# Patient Record
Sex: Female | Born: 1955 | Race: White | Hispanic: No | Marital: Married | Smoking: Current some day smoker
Health system: Southern US, Community
[De-identification: ages and names within clinical notes are randomized; demographics above are authoritative.]

## PROBLEM LIST (undated history)

## (undated) DIAGNOSIS — C569 Malignant neoplasm of unspecified ovary: Secondary | ICD-10-CM

## (undated) DIAGNOSIS — T7840XA Allergy, unspecified, initial encounter: Secondary | ICD-10-CM

## (undated) DIAGNOSIS — K635 Polyp of colon: Secondary | ICD-10-CM

## (undated) DIAGNOSIS — Z8619 Personal history of other infectious and parasitic diseases: Secondary | ICD-10-CM

## (undated) HISTORY — DX: Malignant neoplasm of unspecified ovary: C56.9

## (undated) HISTORY — DX: Allergy, unspecified, initial encounter: T78.40XA

## (undated) HISTORY — DX: Personal history of other infectious and parasitic diseases: Z86.19

## (undated) HISTORY — DX: Polyp of colon: K63.5

---
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
## 2017-09-08 HISTORY — PX: BREAST BIOPSY: SHX20

## 2017-09-08 HISTORY — PX: ABDOMINAL HYSTERECTOMY: SHX81

## 2017-09-08 HISTORY — PX: APPENDECTOMY: SHX54

## 2018-06-30 LAB — PROTIME-INR: INR: 1 (ref 0.9–1.1)

## 2020-06-14 ENCOUNTER — Encounter: Payer: Self-pay | Admitting: Adult Health

## 2020-06-14 ENCOUNTER — Other Ambulatory Visit: Payer: Self-pay

## 2020-06-14 ENCOUNTER — Ambulatory Visit (INDEPENDENT_AMBULATORY_CARE_PROVIDER_SITE_OTHER): Payer: Medicare Other | Admitting: Adult Health

## 2020-06-14 VITALS — BP 92/60 | HR 62 | Temp 98.0°F | Ht 64.0 in | Wt 114.2 lb

## 2020-06-14 DIAGNOSIS — Z23 Encounter for immunization: Secondary | ICD-10-CM

## 2020-06-14 DIAGNOSIS — Z7689 Persons encountering health services in other specified circumstances: Secondary | ICD-10-CM

## 2020-06-14 DIAGNOSIS — Z8543 Personal history of malignant neoplasm of ovary: Secondary | ICD-10-CM

## 2020-06-14 DIAGNOSIS — K635 Polyp of colon: Secondary | ICD-10-CM | POA: Diagnosis not present

## 2020-06-14 DIAGNOSIS — C569 Malignant neoplasm of unspecified ovary: Secondary | ICD-10-CM | POA: Insufficient documentation

## 2020-06-14 NOTE — Patient Instructions (Addendum)
It was great meeting you today   We will follow up with you regarding your blood work   Please schedule a physical exam   Please let me know if you need anything in the meantime

## 2020-06-14 NOTE — Progress Notes (Signed)
  Patient presents to clinic today to establish care. She is a pleasant 64 year old female who  has a past medical history of Allergy, Colon polyps, History of chicken pox, and Ovarian cancer (HCC).  Recently moved from [place] in June 2021.   Acute Concerns: Establish Care   Chronic Issues: Ovarian Cancer - Was diagnosed in 2019. Had a hysterectomy in 2019.  Was seen oncology 4 times a year for routine blood work.  She would like to have blood work done today  Seasonal Allergies - uses OTC allergy medications as needed.   Colon Polyps - last colonoscopy in 2019.  Had a few polyps.  She will request her information from her old gastroenterologist in [place]  Tobacco Use - smokes when stressed  Anxiety/Stress - takes Xanax 1 mg QHS   Health Maintenance: Dental -- Routine Care  Vision - Routine Care  Immunizations -- UTD  Colonoscopy -- 2019 - history of colon polyps  Mammogram -- 2019   PAP -- No longer needs    Past Medical History:  Diagnosis Date  . Allergy   . Colon polyps    history of precancerous  . History of chicken pox   . Ovarian cancer (HCC)    diagnosed 2 years ago-treated by a physician in [place]    Past Surgical History:  Procedure Laterality Date  . ABDOMINAL HYSTERECTOMY  2019  . APPENDECTOMY  2019  . BREAST BIOPSY Right 2019   benign per patient  . TONSILLECTOMY  1965    No current outpatient medications on file prior to visit.   No current facility-administered medications on file prior to visit.    Not on File  Family History  Problem Relation Age of Onset  . Arthritis Mother   . Diabetes Mother   . Hearing loss Mother   . Hypertension Mother   . Hyperlipidemia Mother   . Depression Father   . Heart disease Father   . Heart attack Father   . Multiple myeloma Brother   . Early death Brother   . Hearing loss Maternal Grandmother   . Alcohol abuse Maternal Grandfather   . Cancer Maternal Grandfather   . Cancer Paternal  Grandmother   . Early death Paternal Grandmother   . Miscarriages / Stillbirths Paternal Grandmother   . Diabetes Paternal Grandfather     Social History   Socioeconomic History  . Marital status: Married    Spouse name: Not on file  . Number of children: Not on file  . Years of education: Not on file  . Highest education level: Not on file  Occupational History  . Not on file  Tobacco Use  . Smoking status: Current Some Day Smoker  . Smokeless tobacco: Never Used  Vaping Use  . Vaping Use: Never used  Substance and Sexual Activity  . Alcohol use: Yes  . Drug use: Not Currently  . Sexual activity: Not Currently  Other Topics Concern  . Not on file  Social History Narrative  . Not on file   Social Determinants of Health   Financial Resource Strain:   . Difficulty of Paying Living Expenses: Not on file  Food Insecurity:   . Worried About Running Out of Food in the Last Year: Not on file  . Ran Out of Food in the Last Year: Not on file  Transportation Needs:   . Lack of Transportation (Medical): Not on file  . Lack of Transportation (Non-Medical): Not on file  Physical   Activity:   . Days of Exercise per Week: Not on file  . Minutes of Exercise per Session: Not on file  Stress:   . Feeling of Stress : Not on file  Social Connections:   . Frequency of Communication with Friends and Family: Not on file  . Frequency of Social Gatherings with Friends and Family: Not on file  . Attends Religious Services: Not on file  . Active Member of Clubs or Organizations: Not on file  . Attends Archivist Meetings: Not on file  . Marital Status: Not on file  Intimate Partner Violence:   . Fear of Current or Ex-Partner: Not on file  . Emotionally Abused: Not on file  . Physically Abused: Not on file  . Sexually Abused: Not on file    Review of Systems  Constitutional: Negative.   HENT: Negative.   Eyes: Negative.   Respiratory: Negative.   Cardiovascular: Negative.    Gastrointestinal: Negative.   Genitourinary: Negative.   Musculoskeletal: Negative.   Skin: Negative.   Endo/Heme/Allergies: Negative.   Psychiatric/Behavioral: Negative.   All other systems reviewed and are negative.   BP 92/60 (BP Location: Left Arm, Patient Position: Sitting, Cuff Size: Normal)   Pulse 62   Temp 98 F (36.7 C) (Oral)   Ht 5' 4" (1.626 m)   Wt 114 lb 3.2 oz (51.8 kg)   BMI 19.60 kg/m   Physical Exam Vitals and nursing note reviewed.  Constitutional:      Appearance: Normal appearance.  Cardiovascular:     Rate and Rhythm: Normal rate and regular rhythm.     Pulses: Normal pulses.     Heart sounds: Normal heart sounds.  Pulmonary:     Breath sounds: Normal breath sounds.  Musculoskeletal:        General: Normal range of motion.  Skin:    General: Skin is warm and dry.     Capillary Refill: Capillary refill takes less than 2 seconds.  Neurological:     General: No focal deficit present.     Mental Status: She is alert and oriented to person, place, and time.  Psychiatric:        Mood and Affect: Mood normal.        Behavior: Behavior normal.        Thought Content: Thought content normal.        Judgment: Judgment normal.    Assessment/Plan: 1. Encounter to establish care - Follow up for CPE  - Will review her previous notes once they are faxed over - Stop smoking   2. History of ovarian cancer  - Ambulatory referral to Oncology - CA 125; Future - CEA; Future - CEA - CA 125  3. Need for immunization against influenza  - Flu Vaccine QUAD High Dose(Fluad)   Dorothyann Peng, NP

## 2020-06-15 ENCOUNTER — Telehealth: Payer: Self-pay | Admitting: *Deleted

## 2020-06-15 LAB — CEA: CEA: 1.3 ng/mL

## 2020-06-15 LAB — CA 125: CA 125: 9 U/mL (ref <35)

## 2020-06-15 NOTE — Telephone Encounter (Signed)
Called the patient and scheduled a new patient with Dr Denman George on 10/25 at 11:15 am. Gave the address and phone number for the clinic

## 2020-06-18 ENCOUNTER — Telehealth: Payer: Self-pay | Admitting: *Deleted

## 2020-06-18 ENCOUNTER — Telehealth: Payer: Self-pay | Admitting: Adult Health

## 2020-06-18 NOTE — Telephone Encounter (Signed)
Melissa Holmes from [facility] is calling needing records from Melissa Holmes  that we obtained from another facility for them to be able to keep her scheduled appointment. Please advise Melissa Holmes at [phone].

## 2020-06-18 NOTE — Telephone Encounter (Signed)
Called Edson Snowball, NP and requested records

## 2020-06-19 NOTE — Telephone Encounter (Signed)
After reviewing the chart, I noticed a referral was placed on 10/7.  Spoke with Barbaraann Share at the number below and she stated the pt has an appt on 10/25 and they will not be able to see her without records.  She asked these be faxed to [phone] and message was forwarded to the referral coordinator.

## 2020-06-21 ENCOUNTER — Encounter: Payer: Self-pay | Admitting: Adult Health

## 2020-06-21 ENCOUNTER — Other Ambulatory Visit: Payer: Self-pay

## 2020-06-21 ENCOUNTER — Ambulatory Visit (INDEPENDENT_AMBULATORY_CARE_PROVIDER_SITE_OTHER): Payer: Medicare Other | Admitting: Adult Health

## 2020-06-21 VITALS — BP 112/60 | HR 68 | Temp 98.5°F | Ht 64.0 in | Wt 111.2 lb

## 2020-06-21 DIAGNOSIS — E538 Deficiency of other specified B group vitamins: Secondary | ICD-10-CM

## 2020-06-21 DIAGNOSIS — Z0000 Encounter for general adult medical examination without abnormal findings: Secondary | ICD-10-CM | POA: Diagnosis not present

## 2020-06-21 DIAGNOSIS — F419 Anxiety disorder, unspecified: Secondary | ICD-10-CM

## 2020-06-21 DIAGNOSIS — Z8543 Personal history of malignant neoplasm of ovary: Secondary | ICD-10-CM | POA: Diagnosis not present

## 2020-06-21 NOTE — Patient Instructions (Signed)
It was great seeing you today   We will follow up with you regarding your blood work   I will see you back in one year unless you need anything.

## 2020-06-21 NOTE — Progress Notes (Signed)
Subjective:    Patient ID: Melissa Holmes, female    DOB: 05/26/1956, 64 y.o.   MRN: [id number]  HPI Patient presents for yearly preventative medicine examination. She is a pleasant 64 year old female who  has a past medical history of Allergy, Colon polyps, History of chicken pox, and Ovarian cancer ([place]).  Ovarian Cancer -diagnosed in 2019.  Had a hysterectomy in 2019.  She was recently referred to GYN oncology through the [facility] system since she moved from [place] had and has an upcoming appointment.  We checked her Ca1 25 and CEA labs recently which were normal.  Tobacco Use - smokes when stressed  Anxiety - takes xanax 1 mg PRN   B12 Deficiency -reports that her B12 levels have been low in the past.  All immunizations and health maintenance protocols were reviewed with the patient and needed orders were placed.  Appropriate screening laboratory values were ordered for the patient including screening of hyperlipidemia, renal function and hepatic function.  Medication reconciliation,  past medical history, social history, problem list and allergies were reviewed in detail with the patient  Goals were established with regard to weight loss, exercise, and  diet in compliance with medications.  She does try to eat a heart healthy diet and enjoys doing yoga and being active   Review of Systems  Constitutional: Negative.   HENT: Negative.   Eyes: Negative.   Respiratory: Negative.   Cardiovascular: Negative.   Gastrointestinal: Negative.   Endocrine: Negative.   Genitourinary: Negative.   Musculoskeletal: Negative.   Skin: Negative.   Allergic/Immunologic: Negative.   Neurological: Negative.   Hematological: Negative.   Psychiatric/Behavioral: Negative.    Past Medical History:  Diagnosis Date   Allergy    Colon polyps    history of precancerous   History of chicken pox    Ovarian cancer ([place])    diagnosed 2 years ago-treated by a physician in [place] History   Marital status: Married    Spouse name: Not on file   Number of children: Not on file   Years of education: Not on file   Highest education level: Not on file  Occupational History   Not on file  Tobacco Use   Smoking status: Current Some Day Smoker   Smokeless tobacco: Never Used  Vaping Use   Vaping Use: Never used  Substance and Sexual Activity   Alcohol use: Yes   Drug use: Not Currently   Sexual activity: Not Currently  Other Topics Concern   Not on file  Social History Narrative   Not on file   Social Determinants of Health   Financial Resource Strain:    Difficulty of Paying Living Expenses: Not on file  Food Insecurity:    Worried About [place] in the Last Year: Not on file   Ran Out of Food in the Last Year: Not on file  Transportation Needs:    Lack of Transportation (Medical): Not on file   Lack of Transportation (Non-Medical): Not on file  Physical Activity:    Days of Exercise per Week: Not on file   Minutes of Exercise per Session: Not on file  Stress:    Feeling of Stress : Not on file  Social Connections:    Frequency of Communication with Friends and Family: Not on file   Frequency of Social Gatherings with Friends and Family: Not on file   Attends Religious Services: Not  on file   Active Member of Clubs or Organizations: Not on file   Attends Archivist Meetings: Not on file   Marital Status: Not on file  Intimate Partner Violence:    Fear of Current or Ex-Partner: Not on file   Emotionally Abused: Not on file   Physically Abused: Not on file   Sexually Abused: Not on file    Past Surgical History:  Procedure Laterality Date   ABDOMINAL HYSTERECTOMY  2019   APPENDECTOMY  2019   BREAST BIOPSY Right 2019   benign per patient   TONSILLECTOMY  1965    Family History  Problem Relation Age of Onset   Arthritis Mother    Diabetes Mother     Hearing loss Mother    Hypertension Mother    Hyperlipidemia Mother    Depression Father    Heart disease Father    Heart attack Father    Multiple myeloma Brother    Early death Brother    Hearing loss Maternal Grandmother    Alcohol abuse Maternal Grandfather    Cancer Maternal Grandfather    Cancer Paternal Grandmother    Early death Paternal Grandmother    Miscarriages / Stillbirths Paternal Grandmother    Diabetes Paternal Grandfather     No Known Allergies  No current outpatient medications on file prior to visit.   No current facility-administered medications on file prior to visit.    BP 112/60 (BP Location: Left Arm, Patient Position: Sitting, Cuff Size: Normal)    Pulse 68    Temp 98.5 F (36.9 C) (Oral)    Ht '5\' 4"'  (1.626 m)    Wt 111 lb 3.2 oz (50.4 kg)    BMI 19.09 kg/m       Objective:   Physical Exam Vitals and nursing note reviewed.  Constitutional:      General: She is not in acute distress.    Appearance: Normal appearance. She is well-developed. She is not ill-appearing.  HENT:     Head: Normocephalic and atraumatic.     Right Ear: Tympanic membrane, ear canal and external ear normal. There is no impacted cerumen.     Left Ear: Tympanic membrane, ear canal and external ear normal. There is no impacted cerumen.     Nose: Nose normal. No congestion or rhinorrhea.     Mouth/Throat:     Mouth: Mucous membranes are moist.     Pharynx: Oropharynx is clear. No oropharyngeal exudate or posterior oropharyngeal erythema.  Eyes:     General:        Right eye: No discharge.        Left eye: No discharge.     Extraocular Movements: Extraocular movements intact.     Conjunctiva/sclera: Conjunctivae normal.     Pupils: Pupils are equal, round, and reactive to light.  Neck:     Thyroid: No thyromegaly.     Vascular: No carotid bruit.     Trachea: No tracheal deviation.  Cardiovascular:     Rate and Rhythm: Normal rate and regular rhythm.      Pulses: Normal pulses.     Heart sounds: Normal heart sounds. No murmur heard.  No friction rub. No gallop.   Pulmonary:     Effort: Pulmonary effort is normal. No respiratory distress.     Breath sounds: Normal breath sounds. No stridor. No wheezing, rhonchi or rales.  Chest:     Chest wall: No tenderness.  Abdominal:     General: Abdomen  is flat. Bowel sounds are normal. There is no distension.     Palpations: Abdomen is soft. There is no mass.     Tenderness: There is no abdominal tenderness. There is no right CVA tenderness, left CVA tenderness, guarding or rebound.     Hernia: No hernia is present.  Musculoskeletal:        General: No swelling, tenderness, deformity or signs of injury. Normal range of motion.     Cervical back: Normal range of motion and neck supple.     Right lower leg: No edema.     Left lower leg: No edema.  Lymphadenopathy:     Cervical: No cervical adenopathy.  Skin:    General: Skin is warm and dry.     Coloration: Skin is not jaundiced or pale.     Findings: No bruising, erythema, lesion or rash.  Neurological:     General: No focal deficit present.     Mental Status: She is alert and oriented to person, place, and time.     Cranial Nerves: No cranial nerve deficit.     Sensory: No sensory deficit.     Motor: No weakness.     Coordination: Coordination normal.     Gait: Gait normal.     Deep Tendon Reflexes: Reflexes normal.  Psychiatric:        Mood and Affect: Mood normal.        Behavior: Behavior normal.        Thought Content: Thought content normal.        Judgment: Judgment normal.       Assessment & Plan:  1. Routine general medical examination at a health care facility - Follow up in one year or sooner if needed - Continue heart healthy diet and exercise - CBC with Differential/Platelet; Future - Lipid panel; Future - TSH; Future - CMP with eGFR([facility]); Future - CMP with eGFR([facility]) - TSH - Lipid panel - CBC with  Differential/Platelet  2. History of ovarian cancer - Follow up with oncology as directed  3. B12 deficiency - Consider replacement  - Vitamin B12; Future - Vitamin B12  4. Anxiety - Continue with xanax PRN  Dorothyann Peng, NP

## 2020-06-22 LAB — CBC WITH DIFFERENTIAL/PLATELET
Absolute Monocytes: 377 cells/uL (ref 200–950)
Basophils Absolute: 70 cells/uL (ref 0–200)
Basophils Relative: 1.2 %
Eosinophils Absolute: 58 cells/uL (ref 15–500)
Eosinophils Relative: 1 %
HCT: 36.5 % (ref 35.0–45.0)
Hemoglobin: 12.5 g/dL (ref 11.7–15.5)
Lymphs Abs: 2778 cells/uL (ref 850–3900)
MCH: 33.6 pg — ABNORMAL HIGH (ref 27.0–33.0)
MCHC: 34.2 g/dL (ref 32.0–36.0)
MCV: 98.1 fL (ref 80.0–100.0)
MPV: 10.3 fL (ref 7.5–12.5)
Monocytes Relative: 6.5 %
Neutro Abs: 2517 cells/uL (ref 1500–7800)
Neutrophils Relative %: 43.4 %
Platelets: 388 10*3/uL (ref 140–400)
RBC: 3.72 10*6/uL — ABNORMAL LOW (ref 3.80–5.10)
RDW: 12.9 % (ref 11.0–15.0)
Total Lymphocyte: 47.9 %
WBC: 5.8 10*3/uL (ref 3.8–10.8)

## 2020-06-22 LAB — LIPID PANEL
Cholesterol: 195 mg/dL (ref <200)
HDL: 87 mg/dL (ref >=50)
LDL Cholesterol (Calc): 90 mg/dL
Non-HDL Cholesterol (Calc): 108 mg/dL (ref <130)
Total CHOL/HDL Ratio: 2.2 (calc) (ref <5.0)
Triglycerides: 86 mg/dL (ref <150)

## 2020-06-22 LAB — TSH: TSH: 1.38 m[IU]/L (ref 0.40–4.50)

## 2020-06-22 LAB — COMPLETE METABOLIC PANEL WITH GFR
AG Ratio: 2 (calc) (ref 1.0–2.5)
ALT: 12 U/L (ref 6–29)
AST: 19 U/L (ref 10–35)
Albumin: 4.9 g/dL (ref 3.6–5.1)
Alkaline phosphatase (APISO): 32 U/L — ABNORMAL LOW (ref 37–153)
BUN: 7 mg/dL (ref 7–25)
CO2: 29 mmol/L (ref 20–32)
Calcium: 10.2 mg/dL (ref 8.6–10.4)
Chloride: 102 mmol/L (ref 98–110)
Creat: 0.65 mg/dL (ref 0.50–0.99)
GFR, Est African American: 108 mL/min/{1.73_m2} (ref >=60)
GFR, Est Non African American: 93 mL/min/{1.73_m2} (ref >=60)
Globulin: 2.5 g/dL (calc) (ref 1.9–3.7)
Glucose, Bld: 77 mg/dL (ref 65–99)
Potassium: 4.6 mmol/L (ref 3.5–5.3)
Sodium: 140 mmol/L (ref 135–146)
Total Bilirubin: 0.3 mg/dL (ref 0.2–1.2)
Total Protein: 7.4 g/dL (ref 6.1–8.1)

## 2020-06-22 LAB — VITAMIN B12: Vitamin B-12: 465 pg/mL (ref 200–1100)

## 2020-06-28 ENCOUNTER — Telehealth: Payer: Self-pay | Admitting: *Deleted

## 2020-06-28 NOTE — Telephone Encounter (Signed)
Called the [place]; patient stated that her PCP told her they had just received them. Patient also moved her appt from 10/25 to 12/2. Patient in the progress of moving

## 2020-07-02 ENCOUNTER — Ambulatory Visit: Payer: Medicare Other | Admitting: Gynecologic Oncology

## 2020-07-18 ENCOUNTER — Other Ambulatory Visit: Payer: Self-pay | Admitting: Adult Health

## 2020-07-18 ENCOUNTER — Telehealth: Payer: Self-pay | Admitting: Adult Health

## 2020-07-18 MED ORDER — ALPRAZOLAM 1 MG PO TABS: 1.0000 mg | ORAL_TABLET | Freq: Every day | ORAL | 2 refills | Status: DC | PRN

## 2020-07-18 NOTE — Telephone Encounter (Signed)
Patient stated that she is a new patient here at was prescribed Xanax from her former PCP in [place] and is almost out. Pt wanted to see if she can get a refill sent to [facility] on [address]. Pharamcy # is [phone]. CB is [phone]

## 2020-07-18 NOTE — Telephone Encounter (Signed)
Spoke with patient regarding request for refill on Xanax per patient when she came in for visit she had Rx with her to show that she takes medication. She will be moving this weekend and would like Rx filled if possible. Last OV 06/21/20. Please advise no history or medication Xanax 1mg .

## 2020-08-01 DIAGNOSIS — Z03818 Encounter for observation for suspected exposure to other biological agents ruled out: Secondary | ICD-10-CM | POA: Diagnosis not present

## 2020-08-01 DIAGNOSIS — Z20822 Contact with and (suspected) exposure to covid-19: Secondary | ICD-10-CM | POA: Diagnosis not present

## 2020-08-03 ENCOUNTER — Telehealth: Payer: Self-pay | Admitting: *Deleted

## 2020-08-03 NOTE — Telephone Encounter (Signed)
Called and left the patient a message to call the office back. Need to move the patient from Dr Denman George to Dr Berline Lopes

## 2020-08-06 ENCOUNTER — Telehealth: Payer: Self-pay | Admitting: *Deleted

## 2020-08-06 NOTE — Telephone Encounter (Signed)
Called patient to move appt from Dr Denman George to Dr Berline Lopes. Patient requested to keep Dr Denman George. Explained that I would call her back with a new appt

## 2020-08-09 ENCOUNTER — Ambulatory Visit: Payer: Medicare Other | Admitting: Gynecologic Oncology

## 2020-08-28 ENCOUNTER — Telehealth: Payer: Self-pay | Admitting: Adult Health

## 2020-08-28 NOTE — Telephone Encounter (Signed)
ALPRAZolam (XANAX) 1 MG tablet needs 30 pills. If the dr cant do 30- she will do 20 need by thursday if not called in please call the patient - per patient request   [facility] is the patient new pharmacy, [address], [place],  [address]. please add to the chart new pharmacy

## 2020-08-29 NOTE — Telephone Encounter (Signed)
Spoke with the pt and informed her the Rx was sent by [facility] on 11/10 for #20 with 2 refills.  Patient asked if this could be sent to a different [facility] and I advised her to ask the pharmacy if this can be transferred and she agreed.

## 2020-09-04 ENCOUNTER — Telehealth: Payer: Self-pay

## 2020-09-04 NOTE — Telephone Encounter (Signed)
TC to review meaningful use information, no answer, left message to return call.

## 2020-09-05 ENCOUNTER — Encounter: Payer: Self-pay | Admitting: Gynecologic Oncology

## 2020-09-05 ENCOUNTER — Other Ambulatory Visit: Payer: Self-pay

## 2020-09-05 ENCOUNTER — Inpatient Hospital Stay: Payer: Medicare Other | Attending: Gynecologic Oncology | Admitting: Gynecologic Oncology

## 2020-09-05 VITALS — BP 106/67 | HR 66 | Temp 97.3°F | Resp 18 | Ht 64.0 in | Wt 118.3 lb

## 2020-09-05 DIAGNOSIS — Z08 Encounter for follow-up examination after completed treatment for malignant neoplasm: Secondary | ICD-10-CM | POA: Diagnosis not present

## 2020-09-05 DIAGNOSIS — Z90710 Acquired absence of both cervix and uterus: Secondary | ICD-10-CM | POA: Insufficient documentation

## 2020-09-05 DIAGNOSIS — Z8543 Personal history of malignant neoplasm of ovary: Secondary | ICD-10-CM | POA: Diagnosis present

## 2020-09-05 DIAGNOSIS — C569 Malignant neoplasm of unspecified ovary: Secondary | ICD-10-CM

## 2020-09-05 DIAGNOSIS — Z90722 Acquired absence of ovaries, bilateral: Secondary | ICD-10-CM | POA: Diagnosis not present

## 2020-09-05 NOTE — Progress Notes (Signed)
Consult Note: Gyn-Onc  Consult was requested by Melissa Holmes for the evaluation of Melissa Holmes 64 y.o. female  CC:  Chief Complaint  Patient presents with  . Malignant neoplasm of ovary, unspecified laterality [facility])    Assessment/Plan:  Melissa Holmes  is a 64 y.o.  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  She is NED on today's exam and recent tumor marker assessment.  We reviewed signs and symptoms of recurrence and recommended she contact us for these.   She will return to see me at 6 monthly intervals for surveillance exams and CA 125 assessments until December, 2024.    HPI: Melissa Holmes is a 64 year old P0 who was seen in consultation at the request of Melissa Provencal NP for evaluation of a history of right ovarian cancer diagnosed in 2019.  The patient's tumor history is as follows.  She developed right lower quadrant pain and GI distress in September 2019.  She was living in [place] at that time in [place].  She was seen in emergency department operated on with a laparoscopic appendectomy on Labor Day weekend.  Approximately 1 month following the surgery she developed new right lower quadrant pain and indigestion and was seen again in the emergency department in this time diagnosed with a right ovarian cystic mass that measured approximately 8 cm and was complex cyst, cystic and solid.  She was taken to the operating room for laparoscopic BSO on July 01, 2018.  Pathology from this specimen revealed a FIGO grade 1 endometrioid adenocarcinoma of the right ovary with no surface involvement.  She met with Dr. Kristen Loader at [facility] in [facility] for definitive surgical staging that was performed on 07/21/2018.  This included a robotic hysterectomy, pelvic and para-aortic lymphadenectomy, omentectomy, peritoneal biopsies.  No residual carcinoma or metastatic carcinoma was  identified in any of the specimens.  Complex atypical hyperplasia was noted within the endometrium with no invasion present.  This was felt to be unrelated to her ovarian carcinoma.  She received definitive staging is a stage Ia grade 1 endometrioid adenocarcinoma of the right ovary.  She reported undergoing genetic testing at [facility] in [facility] at the cancer center.  She reported this was negative for BRCA mutations.  No adjuvant therapy was recommended due to her early stage low-grade lesion.  She did not present for typical in person follow-up due to the emergence of the COVID-19 pandemic.  However she did intermittently have virtual visits with a general medical oncologist and had Ca1 25 lab evaluations at 3 monthly intervals.  She relocated from [place] to [place] in the summer 2021 in order to be closer in proximity to her 48 year old mother.  At that time she established care with Melissa Provencal NP.  Ca1 25 was drawn on 08/09/2020 and was normal at 9.  Her medical history is most significant for anxiety.  Her surgical history is most significant for lap appy, lap BSO, robotic hysterectomy and staging including PA and pelvic lymphadenectomy.  Her gynecologic history is remarkable for P0.  Her family cancer history is significant for a brother with a history of multiple myeloma and a grandfather with lung cancer.  She is not currently employed but previously worked as in [employer]. She lives with her husband of 52 years.  She reported no symptoms concerning for recurrence.   Current Meds:  Outpatient Encounter  Medications as of 09/05/2020  Medication Sig  . ALPRAZolam (XANAX) 1 MG tablet Take 1 tablet (1 mg total) by mouth daily as needed for anxiety.  . ASCORBIC ACID PO Take by mouth daily in the afternoon.  . Cholecalciferol (D3 VITAMIN PO) Take by mouth daily in the afternoon.  . Lactobacillus (PROBIOTIC ACIDOPHILUS PO) Take by mouth  daily in the afternoon.   No facility-administered encounter medications on file as of 09/05/2020.    Allergy: No Known Allergies  Social Hx:   Social History   Socioeconomic History  . Marital status: Married    Spouse name: Not on file  . Number of children: Not on file  . Years of education: Not on file  . Highest education level: Not on file  Occupational History  . Not on file  Tobacco Use  . Smoking status: Current Some Day Smoker  . Smokeless tobacco: Never Used  Vaping Use  . Vaping Use: Never used  Substance and Sexual Activity  . Alcohol use: Yes  . Drug use: Not Currently  . Sexual activity: Not Currently  Other Topics Concern  . Not on file  Social History Narrative  . Not on file   Social Determinants of Health   Financial Resource Strain: Not on file  Food Insecurity: Not on file  Transportation Needs: Not on file  Physical Activity: Not on file  Stress: Not on file  Social Connections: Not on file  Intimate Partner Violence: Not on file    Past Surgical Hx:  Past Surgical History:  Procedure Laterality Date  . ABDOMINAL HYSTERECTOMY  2019  . APPENDECTOMY  2019  . BREAST BIOPSY Right 2019   benign per patient  . TONSILLECTOMY  1965    Past Medical Hx:  Past Medical History:  Diagnosis Date  . Allergy   . Colon polyps    history of precancerous  . History of chicken pox   . Ovarian cancer [facility])    diagnosed 2 years ago-treated by a physician in [place]    Past Gynecological History:  See HPI, G0 No LMP recorded. Patient has had a hysterectomy.  Family Hx:  Family History  Problem Relation Age of Onset  . Arthritis Mother   . Diabetes Mother   . Hearing loss Mother   . Hypertension Mother   . Hyperlipidemia Mother   . Depression Father   . Heart disease Father   . Heart attack Father   . Multiple myeloma Brother   . Early death Brother   . Hearing loss Maternal Grandmother   . Alcohol abuse Maternal Grandfather   . Cancer  Maternal Grandfather   . Cancer Paternal Grandmother   . Early death Paternal Grandmother   . Miscarriages / Stillbirths Paternal Grandmother   . Diabetes Paternal Grandfather     Review of Systems:  Constitutional  Feels well,    ENT Normal appearing ears and nares bilaterally Skin/Breast  No rash, sores, jaundice, itching, dryness Cardiovascular  No chest pain, shortness of breath, or edema  Pulmonary  No cough or wheeze.  Gastro Intestinal  No nausea, vomitting, or diarrhoea. No bright red blood per rectum, no abdominal pain, change in bowel movement, or constipation.  Genito Urinary  No frequency, urgency, dysuria, no bleeding Musculo Skeletal  No myalgia, arthralgia, joint swelling or pain  Neurologic  No weakness, numbness, change in gait,  Psychology  No depression, anxiety, insomnia.   Vitals:  Blood pressure 106/67, pulse 66, temperature (!) 97.3 F (  36.3 C), temperature source Tympanic, resp. rate 18, height _0  (1.626 m), weight 118 lb 4.8 oz (53.7 kg), SpO2 100 %.  Physical Exam: WD in NAD Neck  Supple NROM, without any enlargements.  Lymph Node Survey No cervical supraclavicular or inguinal adenopathy Cardiovascular  Pulse normal rate, regularity and rhythm. S1 and S2 normal.  Lungs  Clear to auscultation bilateraly, without wheezes/crackles/rhonchi. Good air movement.  Skin  No rash/lesions/breakdown  Psychiatry  Alert and oriented to person, place, and time  Abdomen  Normoactive bowel sounds, abdomen soft, non-tender and thin without evidence of hernia. Soft laparoscopic incisions. Back No CVA tenderness Genito Urinary  Vulva/vagina: Normal external female genitalia.   No lesions. No discharge or bleeding.  Bladder/urethra:  No lesions or masses, well supported bladder  Vagina: palpably normal  Cervix and uterus surgically absent  Adnexa: no palpable masses. Rectal  deferred Extremities  No bilateral cyanosis, clubbing or edema.  60  minutes of total time was spent for this patient encounter, including preparation, face-to-face counseling with the patient and coordination of care, review of extensive cancer records from outside provders, communication with the referring provider and documentation of the encounter.   Thereasa Solo, MD  09/05/2020, 11:22 AM

## 2020-09-05 NOTE — Patient Instructions (Signed)
Dr Andrey Farmer is recommending follow-up with her every 6 months for the next 3 years. These visits will include an abdominal and pelvic exam and a CA 125 tumor marker lab.  She will see you again in June.  Please notify Dr Andrey Farmer at phone number [phone] if you notice vaginal bleeding, new pelvic or abdominal pains, bloating, feeling full easy, or a change in bladder or bowel function.

## 2020-09-11 ENCOUNTER — Other Ambulatory Visit: Payer: Self-pay | Admitting: Adult Health

## 2020-09-11 DIAGNOSIS — Z1231 Encounter for screening mammogram for malignant neoplasm of breast: Secondary | ICD-10-CM

## 2020-09-19 ENCOUNTER — Other Ambulatory Visit: Payer: Self-pay

## 2020-09-20 ENCOUNTER — Ambulatory Visit (INDEPENDENT_AMBULATORY_CARE_PROVIDER_SITE_OTHER): Payer: Medicare Other | Admitting: Adult Health

## 2020-09-20 ENCOUNTER — Encounter: Payer: Self-pay | Admitting: Adult Health

## 2020-09-20 ENCOUNTER — Ambulatory Visit (INDEPENDENT_AMBULATORY_CARE_PROVIDER_SITE_OTHER): Payer: Medicare Other

## 2020-09-20 VITALS — BP 120/72 | Temp 98.3°F | Wt 115.0 lb

## 2020-09-20 DIAGNOSIS — M25562 Pain in left knee: Secondary | ICD-10-CM | POA: Diagnosis not present

## 2020-09-20 DIAGNOSIS — G8929 Other chronic pain: Secondary | ICD-10-CM

## 2020-09-20 DIAGNOSIS — M1712 Unilateral primary osteoarthritis, left knee: Secondary | ICD-10-CM | POA: Diagnosis not present

## 2020-09-20 NOTE — Progress Notes (Signed)
Subjective:    Patient ID: Melissa Holmes, female    DOB: 1956-06-20, 65 y.o.   MRN: [id number]  HPI 65 year old female who  has a past medical history of Allergy, Colon polyps, History of chicken pox, and Ovarian cancer ([facility]).  She presents to the office today for the complaint of left knee pain. She reports that the pain has been present for multiple years. Pain is more noticeable when she does certain movements such as squats and is more severe after working out at [employer].  She does report that the pain improves after taking a break from working out.  Is able to ambulate without difficulty.  Pain is located right below the kneecap.   Review of Systems See HPI   Past Medical History:  Diagnosis Date  . Allergy   . Colon polyps    history of precancerous  . History of chicken pox   . Ovarian cancer [facility])    diagnosed 2 years ago-treated by a physician in [place] History  . Marital status: Married    Spouse name: Not on file  . Number of children: Not on file  . Years of education: Not on file  . Highest education level: Not on file  Occupational History  . Not on file  Tobacco Use  . Smoking status: Current Some Day Smoker  . Smokeless tobacco: Never Used  Vaping Use  . Vaping Use: Never used  Substance and Sexual Activity  . Alcohol use: Yes  . Drug use: Not Currently  . Sexual activity: Not Currently  Other Topics Concern  . Not on file  Social History Narrative  . Not on file   Social Determinants of Health   Financial Resource Strain: Not on file  Food Insecurity: Not on file  Transportation Needs: Not on file  Physical Activity: Not on file  Stress: Not on file  Social Connections: Not on file  Intimate Partner Violence: Not on file    Past Surgical History:  Procedure Laterality Date  . ABDOMINAL HYSTERECTOMY  2019  . APPENDECTOMY  2019  . BREAST BIOPSY Right 2019   benign per patient  . TONSILLECTOMY   1965    Family History  Problem Relation Age of Onset  . Arthritis Mother   . Diabetes Mother   . Hearing loss Mother   . Hypertension Mother   . Hyperlipidemia Mother   . Depression Father   . Heart disease Father   . Heart attack Father   . Multiple myeloma Brother   . Early death Brother   . Hearing loss Maternal Grandmother   . Alcohol abuse Maternal Grandfather   . Cancer Maternal Grandfather   . Cancer Paternal Grandmother   . Early death Paternal Grandmother   . Miscarriages / Stillbirths Paternal Grandmother   . Diabetes Paternal Grandfather     No Known Allergies  Current Outpatient Medications on File Prior to Visit  Medication Sig Dispense Refill  . ALPRAZolam (XANAX) 1 MG tablet Take 1 tablet (1 mg total) by mouth daily as needed for anxiety. 20 tablet 2  . ASCORBIC ACID PO Take by mouth daily in the afternoon.    . Cholecalciferol (D3 VITAMIN PO) Take by mouth daily in the afternoon.    . Lactobacillus (PROBIOTIC ACIDOPHILUS PO) Take by mouth daily in the afternoon.     No current facility-administered medications on file prior to visit.    BP 120/72  Temp 98.3 F (36.8 C)   Wt 115 lb (52.2 kg)   BMI 19.74 kg/m       Objective:   Physical Exam Vitals and nursing note reviewed.  Constitutional:      Appearance: Normal appearance.  Musculoskeletal:        General: Tenderness present. No swelling. Normal range of motion.     Right knee: No bony tenderness or crepitus. Tenderness present over the patellar tendon. No LCL laxity, MCL laxity, ACL laxity or PCL laxity. Normal alignment, normal meniscus and normal patellar mobility. Normal pulse.     Instability Tests: Anterior drawer test negative. Posterior drawer test negative. Anterior Lachman test negative. Medial McMurray test negative and lateral McMurray test negative.     Left knee: Normal.     Comments: Did have discomfort to meniscal area with external rotation  Skin:    General: Skin is warm  and dry.     Capillary Refill: Capillary refill takes less than 2 seconds.  Neurological:     General: No focal deficit present.     Mental Status: She is alert and oriented to person, place, and time.  Psychiatric:        Mood and Affect: Mood normal.        Behavior: Behavior normal.        Thought Content: Thought content normal.        Judgment: Judgment normal.       Assessment & Plan:  1. Chronic pain of left knee -Likely overuse injury versus osteoarthritis.  Meniscus is intact, no concern for meniscal tear.  Will check x-ray, further imaging if warranted.  Consider referral to orthopedics or steroid injection in the office - DG Knee 1-2 Views Left; Future  Dorothyann Peng, NP

## 2020-09-21 ENCOUNTER — Other Ambulatory Visit: Payer: Self-pay | Admitting: Family Medicine

## 2020-09-21 DIAGNOSIS — Q682 Congenital deformity of knee: Secondary | ICD-10-CM

## 2020-10-04 ENCOUNTER — Other Ambulatory Visit: Payer: Self-pay

## 2020-10-04 ENCOUNTER — Ambulatory Visit: Payer: Medicare Other | Attending: Adult Health

## 2020-10-04 DIAGNOSIS — M25562 Pain in left knee: Secondary | ICD-10-CM | POA: Diagnosis not present

## 2020-10-04 DIAGNOSIS — G8929 Other chronic pain: Secondary | ICD-10-CM | POA: Diagnosis not present

## 2020-10-04 DIAGNOSIS — M6281 Muscle weakness (generalized): Secondary | ICD-10-CM | POA: Insufficient documentation

## 2020-10-04 NOTE — Patient Instructions (Signed)
Access Code: [email or web address] URL: [email or web address] Date: 10/04/2020 Prepared by: Claiborne Billings  Exercises Supine Active Straight Leg Raise - 2 x daily - 7 x weekly - 2 sets - 10 reps Forward T - 2 x daily - 7 x weekly - 2 sets - 10 reps Single Leg Stance - 2 x daily - 7 x weekly - 1 sets - 5 reps - 10-15 hold

## 2020-10-04 NOTE — Therapy (Signed)
[facility] [address] [place], [place], [address] Phone: [phone]   Fax:  [phone]  Physical Therapy Evaluation  Patient Details  Name: Melissa Holmes MRN: [id number] Date of Birth: [birth year] Referring Provider (PT): Carlisle Cater, [place], [place]   Encounter Date: 10/04/2020   PT End of Session - 10/04/20 1056    Visit Number 1    Date for PT Re-Evaluation 11/29/20    Authorization Type UHC Medicare    PT Start Time 1017    PT Stop Time 1100    PT Time Calculation (min) 43 min    Activity Tolerance Patient tolerated treatment well    Behavior During Therapy [facility] for tasks assessed/performed           Past Medical History:  Diagnosis Date  . Allergy   . Colon polyps    history of precancerous  . History of chicken pox   . Ovarian cancer [facility])    diagnosed 2 years ago-treated by a physician in [place]    Past Surgical History:  Procedure Laterality Date  . ABDOMINAL HYSTERECTOMY  2019  . APPENDECTOMY  2019  . BREAST BIOPSY Right 2019   benign per patient  . TONSILLECTOMY  1965    There were no vitals filed for this visit.    Subjective Assessment - 10/04/20 1029    Subjective I have had Lt pain for a long time.  It has gotten worse recently.  I want to get back to exercise and I can't kneel on it.  X-ray showed patella alta.    Pertinent History ovarian cancer    Diagnostic tests x-ray: mild patella alta    Patient Stated Goals exercise without limitation, reduce knee pain    Currently in Pain? Yes    Pain Score 0-No pain   up to 6/10   Pain Location Knee    Pain Orientation Left    Pain Descriptors / Indicators Shooting;Dull    Pain Type Chronic pain    Pain Onset More than a month ago    Pain Frequency Intermittent    Aggravating Factors  crossing Lt leg over Rt, kneeling, extension.  Gym exercise: plie position squat, kneeling, mini squat/bend of the Lt knee    Pain Relieving Factors when  not performing aggravating activity, Advil    Effect of Pain on Daily Activities not able to exercise like I want to              [facility] PT Assessment - 10/04/20 0001      Assessment   Medical Diagnosis patella alta Lt    Referring Provider (PT) Nafziger, [place], [place]    Onset Date/Surgical Date 07/12/20    Next MD Visit as neededd    Prior Therapy none      Precautions   Precautions None      Restrictions   Weight Bearing Restrictions No      Balance Screen   Has the patient fallen in the past 6 months No    Has the patient had a decrease in activity level because of a fear of falling?  No    Is the patient reluctant to leave their home because of a fear of falling?  No      Home Ecologist residence    Living Arrangements Alone      Prior Function   Level of Independence Independent    Vocation Retired      Associate Professor  Overall Cognitive Status Within Functional Limits for tasks assessed      Observation/Other Assessments   Focus on Therapeutic Outcomes (FOTO)  60 (goal 71)      ROM / Strength   AROM / PROM / Strength AROM;PROM;Strength      AROM   Overall AROM  Within functional limits for tasks performed    Overall AROM Comments full hip and knee A/ROM without pain      PROM   Overall PROM  Within functional limits for tasks performed      Strength   Overall Strength Deficits    Overall Strength Comments Lt hip 4/5, knee 4+/5.  Rt hip 4+/5, 5/5 knee      Palpation   Spinal mobility reduced inferior glide.  Crepitus with patellar mobs    Palpation comment palpable tenderness over Lt patellar tendon      Transfers   Transfers Sit to Stand;Stand to Sit;Independent with all Transfers      Ambulation/Gait   Ambulation/Gait Yes    Gait Pattern Within Functional Limits    Stairs Yes    Stairs Assistance 6: Modified independent (Device/Increase time)    Pre-Gait Activities single leg stance: instability on the Lt with SLS vs the  Rt    Gait Comments pain with steps and reduced stability on the Lt with ascending and descending                      Objective measurements completed on examination: See above findings.               PT Education - 10/04/20 1055    Education Details Access Code: [email or web address]    Person(s) Educated Patient    Methods Explanation;Demonstration;Handout    Comprehension Verbalized understanding;Returned demonstration            PT Short Term Goals - 10/04/20 1140      PT SHORT TERM GOAL #1   Title be independent in initial HEP    Time 4    Period Weeks    Status New    Target Date 11/01/20      PT SHORT TERM GOAL #2   Title initiate a gym exercise program and report ability to modify for knee pain    Time 4    Period Weeks    Status New    Target Date 11/01/20             PT Long Term Goals - 10/04/20 1039      PT LONG TERM GOAL #1   Title be independent in advanced HEP    Time 8    Period Weeks    Status New    Target Date 11/29/20      PT LONG TERM GOAL #2   Title improve FOTO FS score to > or = to 71    Time 8    Period Weeks    Status New    Target Date 11/29/20      PT LONG TERM GOAL #3   Title improve Lt LE strength to negotiate steps with symmetry and control    Time 8    Period Weeks    Status New    Target Date 11/29/20      PT LONG TERM GOAL #4   Title report < or = to 3/10 Lt knee pain with squatting, lunging and crossing leg    Time 8    Period Weeks    Status  New    Target Date 11/29/20                  Plan - 10/04/20 1107    Clinical Impression Statement Pt presents to PT with complaints of chronic Lt knee pain.  Pt reports that she started [place] classes recently and noticed that she is having pain with certain motions within her workout.  Pt repors 6/10 Lt knee pain with crossing Lt knee over Rt, extending knee and with kneeling on the Lt knee.  Pt had recent x-ray that showed patella alta on the Lt. Pt  with reduced knee stability with single limb stance on the Lt and with negotiating steps.  PT educated in HEP for hip and knee strength/stability and pt would like to try in addition to initiating a gym program and will check back in 3-4 weeks as needed.    Personal Factors and Comorbidities Comorbidity 1    Comorbidities chronic Lt knee pain    Examination-Activity Limitations Sit;Transfers;Squat;Stairs    Examination-Participation Restrictions Community Activity    Stability/Clinical Decision Making Stable/Uncomplicated    Clinical Decision Making Low    Rehab Potential Good    PT Frequency 1x / week    PT Duration 8 weeks    PT Treatment/Interventions ADLs/Self Care Home Management;Cryotherapy;Moist Heat;Electrical Stimulation;Gait training;Stair training;Functional mobility training;Therapeutic activities;Therapeutic exercise;Balance training;Neuromuscular re-education;Patient/family education;Taping;Dry needling    PT Next Visit Plan check in after pt tries HEP and gym exercises.  work on Lt knee and hip stability    PT Home Exercise Plan Access Code: [email or web address]    Consulted and Agree with Plan of Care Patient           Patient will benefit from skilled therapeutic intervention in order to improve the following deficits and impairments:  Decreased activity tolerance,Decreased strength,Pain,Difficulty walking,Decreased endurance  Visit Diagnosis: Chronic pain of left knee - Plan: PT plan of care cert/re-cert  Muscle weakness (generalized) - Plan: PT plan of care cert/re-cert     Problem List Patient Active Problem List   Diagnosis Date Noted  . Ovarian cancer (HCC)   . Colon polyps      Lorrene Reid, PT 10/04/20 11:46 AM  [facility] [address] [place], [place], [address] Phone: [phone]   Fax:  [phone]  Name: Ayanah Snader MRN: [id number] Date of Birth: [birth year]

## 2020-10-15 ENCOUNTER — Other Ambulatory Visit: Payer: Self-pay | Admitting: Occupational Medicine

## 2020-10-15 ENCOUNTER — Other Ambulatory Visit: Payer: Self-pay

## 2020-10-15 ENCOUNTER — Ambulatory Visit: Payer: Self-pay

## 2020-10-15 DIAGNOSIS — Z0000 Encounter for general adult medical examination without abnormal findings: Secondary | ICD-10-CM

## 2020-10-19 ENCOUNTER — Ambulatory Visit
Admission: RE | Admit: 2020-10-19 | Discharge: 2020-10-19 | Disposition: A | Discharge status: Home / Self Care | Payer: Medicare Other | Source: Ambulatory Visit | Attending: Adult Health | Admitting: Adult Health

## 2020-10-19 ENCOUNTER — Other Ambulatory Visit: Payer: Self-pay

## 2020-10-19 DIAGNOSIS — Z1231 Encounter for screening mammogram for malignant neoplasm of breast: Secondary | ICD-10-CM

## 2020-10-25 ENCOUNTER — Other Ambulatory Visit: Payer: Self-pay | Admitting: Adult Health

## 2020-10-25 DIAGNOSIS — R928 Other abnormal and inconclusive findings on diagnostic imaging of breast: Secondary | ICD-10-CM

## 2020-10-31 ENCOUNTER — Ambulatory Visit: Payer: Medicare Other

## 2020-11-29 ENCOUNTER — Telehealth: Payer: Self-pay | Admitting: Adult Health

## 2020-11-29 MED ORDER — ALPRAZOLAM 1 MG PO TABS
1.0000 mg | ORAL_TABLET | Freq: Every day | ORAL | 2 refills | Status: DC | PRN
Start: 2020-11-29 — End: 2021-03-28

## 2020-11-29 NOTE — Telephone Encounter (Signed)
Patient is calling and requesting a refill for ALPRAZolam Duanne Moron) 1 MG tablet to be sent to [facility] on [address], please advise. CB is [phone]

## 2020-11-29 NOTE — Telephone Encounter (Signed)
Last filled 07/18/2020 Last OV 09/20/2020  Ok to fill?

## 2020-12-26 DIAGNOSIS — H52203 Unspecified astigmatism, bilateral: Secondary | ICD-10-CM | POA: Diagnosis not present

## 2020-12-26 DIAGNOSIS — H5203 Hypermetropia, bilateral: Secondary | ICD-10-CM | POA: Diagnosis not present

## 2020-12-26 DIAGNOSIS — H2513 Age-related nuclear cataract, bilateral: Secondary | ICD-10-CM | POA: Diagnosis not present

## 2020-12-31 ENCOUNTER — Other Ambulatory Visit: Payer: Self-pay

## 2020-12-31 ENCOUNTER — Ambulatory Visit: Payer: Medicare Other

## 2020-12-31 ENCOUNTER — Ambulatory Visit
Admission: RE | Admit: 2020-12-31 | Discharge: 2020-12-31 | Disposition: A | Discharge status: Home / Self Care | Payer: Medicare Other | Source: Ambulatory Visit | Attending: Adult Health | Admitting: Adult Health

## 2020-12-31 DIAGNOSIS — R928 Other abnormal and inconclusive findings on diagnostic imaging of breast: Secondary | ICD-10-CM

## 2020-12-31 DIAGNOSIS — R922 Inconclusive mammogram: Secondary | ICD-10-CM | POA: Diagnosis not present

## 2021-02-26 ENCOUNTER — Inpatient Hospital Stay: Payer: Medicare Other

## 2021-02-26 ENCOUNTER — Inpatient Hospital Stay: Payer: Medicare Other | Attending: Gynecologic Oncology | Admitting: Gynecologic Oncology

## 2021-02-26 ENCOUNTER — Other Ambulatory Visit: Payer: Self-pay

## 2021-02-26 VITALS — BP 112/71 | HR 60 | Temp 97.4°F | Resp 18 | Ht 64.0 in | Wt 109.8 lb

## 2021-02-26 DIAGNOSIS — Z90722 Acquired absence of ovaries, bilateral: Secondary | ICD-10-CM | POA: Diagnosis not present

## 2021-02-26 DIAGNOSIS — Z90710 Acquired absence of both cervix and uterus: Secondary | ICD-10-CM | POA: Insufficient documentation

## 2021-02-26 DIAGNOSIS — Z8543 Personal history of malignant neoplasm of ovary: Secondary | ICD-10-CM | POA: Diagnosis not present

## 2021-02-26 DIAGNOSIS — C569 Malignant neoplasm of unspecified ovary: Secondary | ICD-10-CM

## 2021-02-26 NOTE — Progress Notes (Signed)
Follow-up Note: Gyn-Onc  Consult was requested by Sallee Provencal for the evaluation of Melissa Holmes 65 y.o. female  CC:  Chief Complaint  Patient presents with   Malignant neoplasm of ovary, unspecified laterality [facility])    Assessment/Plan:  [name]  is a 65 y.o.  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  She is NED on today's exam.  We reviewed signs and symptoms of recurrence and recommended she contact us for these.   She will return to see [place] at 6 monthly intervals for surveillance exams and CA 125 assessments until December, 2024.    HPI: Ms Melissa Holmes is a 64 year old P0 who was seen in consultation at the request of Sallee Provencal NP for evaluation of a history of right ovarian cancer diagnosed in 2019.  The patient's tumor history is as follows.  She developed right lower quadrant pain and GI distress in September 2019.  She was living in [place] at that time in [place].  She was seen in emergency department operated on with a laparoscopic appendectomy on Labor Day weekend.  Approximately 1 month following the surgery she developed new right lower quadrant pain and indigestion and was seen again in the emergency department in this time diagnosed with a right ovarian cystic mass that measured approximately 8 cm and was complex cyst, cystic and solid.  She was taken to the operating room for laparoscopic BSO on July 01, 2018.  Pathology from this specimen revealed a FIGO grade 1 endometrioid adenocarcinoma of the right ovary with no surface involvement.  She met with Dr. Kristen Loader at [facility] oncology in [facility] for definitive surgical staging that was performed on 07/21/2018.  This included a robotic hysterectomy, pelvic and para-aortic lymphadenectomy, omentectomy, peritoneal biopsies.  No residual carcinoma or metastatic carcinoma was identified in any  of the specimens.  Complex atypical hyperplasia was noted within the endometrium with no invasion present.  This was felt to be unrelated to her ovarian carcinoma.  She received definitive staging is a stage Ia grade 1 endometrioid adenocarcinoma of the right ovary.  She reported undergoing genetic testing at [facility] at the cancer center.  She reported this was negative for BRCA mutations.  No adjuvant therapy was recommended due to her early stage low-grade lesion.  She did not present for typical in person follow-up due to the emergence of the COVID-19 pandemic.  However she did intermittently have virtual visits with a general medical oncologist and had Ca1 25 lab evaluations at 3 monthly intervals.  She relocated from [place] to [place] in the summer 2021 in order to be closer in proximity to her 34 year old mother.  At that time she established care with Sallee Provencal NP.  Ca1 25 was drawn on 08/09/2020 and was normal at 9.  Interval Hx: She has no symptoms concerning for recurrence.  Current Meds:  Outpatient Encounter Medications as of 02/26/2021  Medication Sig   ALPRAZolam (XANAX) 1 MG tablet Take 1 tablet (1 mg total) by mouth daily as needed for anxiety.   ASCORBIC ACID PO Take by mouth daily in the afternoon.   Cholecalciferol (D3 VITAMIN PO) Take by mouth daily in the afternoon.   Lactobacillus (PROBIOTIC ACIDOPHILUS PO) Take by mouth daily in the afternoon.   No facility-administered encounter medications on file as of 02/26/2021.    Allergy: No Known Allergies  Social Hx:   Social History   Socioeconomic History   Marital status: Married    Spouse name: Not on file   Number of children: Not on file   Years of education: Not on file   Highest education level: Not on file  Occupational History   Not on file  Tobacco Use   Smoking status: Some Days    Pack years: 0.00   Smokeless tobacco: Never  Vaping Use   Vaping Use:  Never used  Substance and Sexual Activity   Alcohol use: Yes   Drug use: Not Currently   Sexual activity: Not Currently  Other Topics Concern   Not on file  Social History Narrative   Not on file   Social Determinants of Health   Financial Resource Strain: Not on file  Food Insecurity: Not on file  Transportation Needs: Not on file  Physical Activity: Not on file  Stress: Not on file  Social Connections: Not on file  Intimate Partner Violence: Not on file    Past Surgical Hx:  Past Surgical History:  Procedure Laterality Date   ABDOMINAL HYSTERECTOMY  2019   APPENDECTOMY  2019   BREAST BIOPSY Right 2019   benign per patient   TONSILLECTOMY  1965    Past Medical Hx:  Past Medical History:  Diagnosis Date   Allergy    Colon polyps    history of precancerous   History of chicken pox    Ovarian cancer ([place])    diagnosed 2 years ago-treated by a physician in [place]    Past Gynecological History:  See HPI, G0 No LMP recorded. Patient has had a hysterectomy.  Family Hx:  Family History  Problem Relation Age of Onset   Arthritis Mother    Diabetes Mother    Hearing loss Mother    Hypertension Mother    Hyperlipidemia Mother    Depression Father    Heart disease Father    Heart attack Father    Multiple myeloma Brother    Early death Brother    Hearing loss Maternal Grandmother    Alcohol abuse Maternal Grandfather    Cancer Maternal Grandfather    Cancer Paternal Grandmother    Early death Paternal 41 / Stillbirths Paternal Grandmother    Diabetes Paternal Grandfather     Review of Systems:  Constitutional  Feels well,    ENT Normal appearing ears and nares bilaterally Skin/Breast  No rash, sores, jaundice, itching, dryness Cardiovascular  No chest pain, shortness of breath, or edema  Pulmonary  No cough or wheeze.  Gastro Intestinal  No nausea, vomitting, or diarrhoea. No bright red blood per rectum, no abdominal pain,  change in bowel movement, or constipation.  Genito Urinary  No frequency, urgency, dysuria, no bleeding Musculo Skeletal  No myalgia, arthralgia, joint swelling or pain  Neurologic  No weakness, numbness, change in gait,  Psychology  No depression, anxiety, insomnia.   Vitals:  Blood pressure 112/71, pulse 60, temperature (!) 97.4 F (36.3 C), temperature source Tympanic, resp. rate 18, height _0  (1.626 m), weight 109 lb 12.8 oz (49.8 kg), SpO2 100 %.  Physical Exam: WD in NAD Neck  Supple NROM, without any enlargements.  Lymph Node Survey No cervical supraclavicular or inguinal adenopathy Cardiovascular  Pulse normal rate, regularity and rhythm. S1 and S2 normal.  Lungs  Clear to auscultation bilateraly, without wheezes/crackles/rhonchi. Good air movement.  Skin  No rash/lesions/breakdown  Psychiatry  Alert and oriented  to person, place, and time  Abdomen  Normoactive bowel sounds, abdomen soft, non-tender and thin without evidence of hernia. Soft laparoscopic incisions. Back No CVA tenderness Genito Urinary  Vulva/vagina: Normal external female genitalia.   No lesions. No discharge or bleeding.  Bladder/urethra:  No lesions or masses, well supported bladder  Vagina: palpably normal  Cervix and uterus surgically absent  Adnexa: no palpable masses. Rectal  deferred Extremities  No bilateral cyanosis, clubbing or edema.  Thereasa Solo, MD  02/26/2021, 1:41 PM

## 2021-02-26 NOTE — Patient Instructions (Signed)
Please notify Dr Denman George at phone number [phone] if you notice vaginal bleeding, new pelvic or abdominal pains, bloating, feeling full easy, or a change in bladder or bowel function.   Dr Denman George is departing the [place] at [facility] in October, 2022. Her partners and colleagues including Dr Berline Lopes, Dr Delsa Sale and Joylene John, Nurse Practitioner will be available to continue your care.   You are next scheduled to return to the Gynecologic Oncology office at the [facility] in December with Joylene John NP.

## 2021-02-27 LAB — CA 125: Cancer Antigen (CA) 125: 8.4 U/mL (ref 0.0–38.1)

## 2021-02-28 ENCOUNTER — Telehealth: Payer: Self-pay

## 2021-02-28 NOTE — Telephone Encounter (Signed)
Notified Melissa Holmes that her CA 125 is within normal range. Patient verbalized understanding and has no questions.

## 2021-03-28 ENCOUNTER — Telehealth: Payer: Self-pay | Admitting: Adult Health

## 2021-03-28 ENCOUNTER — Other Ambulatory Visit: Payer: Self-pay | Admitting: Adult Health

## 2021-03-28 MED ORDER — ALPRAZOLAM 1 MG PO TABS: 1.0000 mg | ORAL_TABLET | Freq: Every day | ORAL | 2 refills | Status: DC | PRN

## 2021-03-28 NOTE — Telephone Encounter (Signed)
Pt call and stated she need a refill on ALPRAZolam Duanne Moron) 1 MG tablet sent to  [facility], [place] [facility] [place] [address] AT [address] Phone:  [phone]  Fax:  [phone]

## 2021-03-28 NOTE — Telephone Encounter (Signed)
[facility] for refill?  LOV 09/20/2020  Last refill 20 qty with 2refills

## 2021-05-20 ENCOUNTER — Telehealth: Payer: Self-pay | Admitting: Adult Health

## 2021-05-20 NOTE — Telephone Encounter (Signed)
Left message for patient to call back and schedule Medicare Annual Wellness Visit (AWV) either virtually or in office. Left  my Melissa Holmes number [phone]   awvi 05/09/21 per [place] please schedule at anytime with [facility] Nurse Health Advisor 1 or 2   This should be a 45 minute visit.

## 2021-06-10 ENCOUNTER — Ambulatory Visit (INDEPENDENT_AMBULATORY_CARE_PROVIDER_SITE_OTHER): Payer: Medicare Other

## 2021-06-10 ENCOUNTER — Other Ambulatory Visit: Payer: Self-pay

## 2021-06-10 VITALS — BP 102/62 | HR 68 | Temp 98.7°F | Ht 64.0 in | Wt 111.0 lb

## 2021-06-10 DIAGNOSIS — Z780 Asymptomatic menopausal state: Secondary | ICD-10-CM

## 2021-06-10 DIAGNOSIS — Z0000 Encounter for general adult medical examination without abnormal findings: Secondary | ICD-10-CM

## 2021-06-10 NOTE — Patient Instructions (Signed)
Melissa Holmes , Thank you for taking time to come for your Medicare Wellness Visit. I appreciate your ongoing commitment to your health goals. Please review the following plan we discussed and let me know if I can assist you in the future.   Screening recommendations/referrals: Colonoscopy: 09/08/2017 Mammogram: 12/31/2020 Bone Density: ordered 06/10/2021 Recommended yearly ophthalmology/optometry visit for glaucoma screening and checkup Recommended yearly dental visit for hygiene and checkup  Vaccinations: Influenza vaccine: due in fall 2022  Pneumococcal vaccine: will consider  Tdap vaccine: due upon injury  Shingles vaccine: will consider     Advanced directives: none   Conditions/risks identified: none   Next appointment: none    Preventive Care 26 Years and Older, Female Preventive care refers to lifestyle choices and visits with your health care provider that can promote health and wellness. What does preventive care include? A yearly physical exam. This is also called an annual well check. Dental exams once or twice a year. Routine eye exams. Ask your health care provider how often you should have your eyes checked. Personal lifestyle choices, including: Daily care of your teeth and gums. Regular physical activity. Eating a healthy diet. Avoiding tobacco and drug use. Limiting alcohol use. Practicing safe sex. Taking low-dose aspirin every day. Taking vitamin and mineral supplements as recommended by your health care provider. What happens during an annual well check? The services and screenings done by your health care provider during your annual well check will depend on your age, overall health, lifestyle risk factors, and family history of disease. Counseling  Your health care provider may ask you questions about your: Alcohol use. Tobacco use. Drug use. Emotional well-being. Home and relationship well-being. Sexual activity. Eating habits. History of  falls. Memory and ability to understand (cognition). Work and work Statistician. Reproductive health. Screening  You may have the following tests or measurements: Height, weight, and BMI. Blood pressure. Lipid and cholesterol levels. These may be checked every 5 years, or more frequently if you are over 49 years old. Skin check. Lung cancer screening. You may have this screening every year starting at age 22 if you have a 30-pack-year history of smoking and currently smoke or have quit within the past 15 years. Fecal occult blood test (FOBT) of the stool. You may have this test every year starting at age 43. Flexible sigmoidoscopy or colonoscopy. You may have a sigmoidoscopy every 5 years or a colonoscopy every 10 years starting at age 18. Hepatitis C blood test. Hepatitis B blood test. Sexually transmitted disease (STD) testing. Diabetes screening. This is done by checking your blood sugar (glucose) after you have not eaten for a while (fasting). You may have this done every 1-3 years. Bone density scan. This is done to screen for osteoporosis. You may have this done starting at age 22. Mammogram. This may be done every 1-2 years. Talk to your health care provider about how often you should have regular mammograms. Talk with your health care provider about your test results, treatment options, and if necessary, the need for more tests. Vaccines  Your health care provider may recommend certain vaccines, such as: Influenza vaccine. This is recommended every year. Tetanus, diphtheria, and acellular pertussis (Tdap, Td) vaccine. You may need a Td booster every 10 years. Zoster vaccine. You may need this after age 4. Pneumococcal 13-valent conjugate (PCV13) vaccine. One dose is recommended after age 10. Pneumococcal polysaccharide (PPSV23) vaccine. One dose is recommended after age 42. Talk to your health care provider about which  screenings and vaccines you need and how often you need  them. This information is not intended to replace advice given to you by your health care provider. Make sure you discuss any questions you have with your health care provider. Document Released: 09/21/2015 Document Revised: 05/14/2016 Document Reviewed: 06/26/2015 Elsevier Interactive Patient Education  2017 [place] Prevention in the Home Falls can cause injuries. They can happen to people of all ages. There are many things you can do to make your home safe and to help prevent falls. What can I do on the outside of my home? Regularly fix the edges of walkways and driveways and fix any cracks. Remove anything that might make you trip as you walk through a door, such as a raised step or threshold. Trim any bushes or trees on the path to your home. Use bright outdoor lighting. Clear any walking paths of anything that might make someone trip, such as rocks or tools. Regularly check to see if handrails are loose or broken. Make sure that both sides of any steps have handrails. Any raised decks and porches should have guardrails on the edges. Have any leaves, snow, or ice cleared regularly. Use sand or salt on walking paths during winter. Clean up any spills in your garage right away. This includes oil or grease spills. What can I do in the bathroom? Use night lights. Install grab bars by the toilet and in the tub and shower. Do not use towel bars as grab bars. Use non-skid mats or decals in the tub or shower. If you need to sit down in the shower, use a plastic, non-slip stool. Keep the floor dry. Clean up any water that spills on the floor as soon as it happens. Remove soap buildup in the tub or shower regularly. Attach bath mats securely with double-sided non-slip rug tape. Do not have throw rugs and other things on the floor that can make you trip. What can I do in the bedroom? Use night lights. Make sure that you have a light by your bed that is easy to reach. Do not use  any sheets or blankets that are too big for your bed. They should not hang down onto the floor. Have a firm chair that has side arms. You can use this for support while you get dressed. Do not have throw rugs and other things on the floor that can make you trip. What can I do in the kitchen? Clean up any spills right away. Avoid walking on wet floors. Keep items that you use a lot in easy-to-reach places. If you need to reach something above you, use a strong step stool that has a grab bar. Keep electrical cords out of the way. Do not use floor polish or wax that makes floors slippery. If you must use wax, use non-skid floor wax. Do not have throw rugs and other things on the floor that can make you trip. What can I do with my stairs? Do not leave any items on the stairs. Make sure that there are handrails on both sides of the stairs and use them. Fix handrails that are broken or loose. Make sure that handrails are as long as the stairways. Check any carpeting to make sure that it is firmly attached to the stairs. Fix any carpet that is loose or worn. Avoid having throw rugs at the top or bottom of the stairs. If you do have throw rugs, attach them to the floor with carpet  tape. Make sure that you have a light switch at the top of the stairs and the bottom of the stairs. If you do not have them, ask someone to add them for you. What else can I do to help prevent falls? Wear shoes that: Do not have high heels. Have rubber bottoms. Are comfortable and fit you well. Are closed at the toe. Do not wear sandals. If you use a stepladder: Make sure that it is fully opened. Do not climb a closed stepladder. Make sure that both sides of the stepladder are locked into place. Ask someone to hold it for you, if possible. Clearly mark and make sure that you can see: Any grab bars or handrails. First and last steps. Where the edge of each step is. Use tools that help you move around (mobility aids)  if they are needed. These include: Canes. Walkers. Scooters. Crutches. Turn on the lights when you go into a dark area. Replace any light bulbs as soon as they burn out. Set up your furniture so you have a clear path. Avoid moving your furniture around. If any of your floors are uneven, fix them. If there are any pets around you, be aware of where they are. Review your medicines with your doctor. Some medicines can make you feel dizzy. This can increase your chance of falling. Ask your doctor what other things that you can do to help prevent falls. This information is not intended to replace advice given to you by your health care provider. Make sure you discuss any questions you have with your health care provider. Document Released: 06/21/2009 Document Revised: 01/31/2016 Document Reviewed: 09/29/2014 Elsevier Interactive Patient Education  2017 [employer].

## 2021-06-10 NOTE — Progress Notes (Addendum)
Subjective:   Melissa Holmes is a 65 y.o. female who presents for an Initial Medicare Annual Wellness Visit.  Review of Systems     Cardiac Risk Factors include: advanced age (>84mn, >>68women)     Objective:    Today's Vitals   06/10/21 1309  BP: 102/62  Pulse: 68  Temp: 98.7 F (37.1 C)  SpO2: 98%  Weight: 111 lb (50.3 kg)  Height: 5' 4" (1.626 m)   Body mass index is 19.05 kg/m.  Advanced Directives 06/10/2021 10/04/2020  Does Patient Have a Medical Advance Directive? No No  Does patient want to make changes to medical advance directive? - No - Patient declined  [place]? - No - copy requested  Would patient like information on creating a medical advance directive? No - Patient declined No - Patient declined    Current Medications (verified) Outpatient Encounter Medications as of 06/10/2021  Medication Sig   ALPRAZolam (XANAX) 1 MG tablet Take 1 tablet (1 mg total) by mouth daily as needed for anxiety.   ASCORBIC ACID PO Take by mouth daily in the afternoon.   Cholecalciferol (D3 VITAMIN PO) Take by mouth daily in the afternoon.   Lactobacillus (PROBIOTIC ACIDOPHILUS PO) Take by mouth daily in the afternoon.   No facility-administered encounter medications on file as of 06/10/2021.    Allergies (verified) Patient has no known allergies.   History: Past Medical History:  Diagnosis Date   Allergy    Colon polyps    history of precancerous   History of chicken pox    [place]    [place]  Past Surgical History:  Procedure Laterality Date   ABDOMINAL HYSTERECTOMY  2019   APPENDECTOMY  2019   BREAST BIOPSY Right 2019   benign per patient   TONSILLECTOMY  1965   Family History  Problem Relation Age of Onset   Arthritis Mother    Diabetes Mother    Hearing loss Mother    Hypertension Mother    Hyperlipidemia Mother    Depression Father    Heart  disease Father    Heart attack Father    Multiple myeloma Brother    Early death Brother    Hearing loss Maternal Grandmother    Alcohol abuse Maternal Grandfather    Cancer Maternal Grandfather    Cancer Paternal Grandmother    Early death Paternal G3/ Stillbirths Paternal Grandmother    Diabetes Paternal Grandfather    Social History   Socioeconomic History   Marital status: Married    Spouse name: Not on file   Number of children: Not on file   Years of education: Not on file   Highest education level: Not on file  Occupational History   Not on file  Tobacco Use   Smoking status: Some Days   Smokeless tobacco: Never  Vaping Use   Vaping Use: Never used  Substance and Sexual Activity   Alcohol use: Yes   Drug use: Not Currently   Sexual activity: Not Currently  Other Topics Concern   Not on file  Social History Narrative   Not on file   Social Determinants of Health   Financial Resource Strain: Low Risk    Difficulty of Paying Living Expenses: Not hard at all  Food Insecurity: No Food Insecurity   Worried About [place] the Last Year: Never  true   Ran Out of Food in the Last Year: Never true  Transportation Needs: No Transportation Needs   Lack of Transportation (Medical): No   Lack of Transportation (Non-Medical): No  Physical Activity: Sufficiently Active   Days of Exercise per Week: 3 days   Minutes of Exercise per Session: 60 min  Stress: No Stress Concern Present   Feeling of Stress : Not at all  Social Connections: Moderately Integrated   Frequency of Communication with Friends and Family: Twice a week   Frequency of Social Gatherings with Friends and Family: Twice a week   Attends Religious Services: Never   Marine scientist or Organizations: Yes   Attends Music therapist: More than 4 times per year   Marital Status: Married    Tobacco Counseling Ready to quit: Not Answered Counseling  given: Not Answered   Clinical Intake:  Pre-visit preparation completed: Yes  Pain : No/denies pain     Nutritional Risks: None Diabetes: No  How often do you need to have someone help you when you read instructions, pamphlets, or other written materials from your doctor or pharmacy?: 1 - Never What is the last grade level you completed in school?: college  Diabetic?no  Interpreter Needed?: No  Information entered by :: [place] of Daily Living In your present state of health, do you have any difficulty performing the following activities: 06/12/2021 06/10/2021  Hearing? N N  Vision? N N  Difficulty concentrating or making decisions? N N  Walking or climbing stairs? N N  Dressing or bathing? N N  Doing errands, shopping? N N  Preparing Food and eating ? N N  Using the Toilet? N N  In the past six months, have you accidently leaked urine? N N  Do you have problems with loss of bowel control? N N  Managing your Medications? N N  Managing your Finances? N N  Housekeeping or managing your Housekeeping? N N  Some recent data might be hidden    Patient Care Team: Dorothyann Peng, NP as PCP - General (Family Medicine)  Indicate any recent Medical Services you may have received from other than [facility] providers in the past year (date may be approximate).     Assessment:   This is a routine wellness examination for Melissa Holmes.  Hearing/Vision screen Vision Screening - Comments:: Annual  eye exams wears glasses   Dietary issues and exercise activities discussed: Current Exercise Habits: Home exercise routine, Type of exercise: walking, Time (Minutes): 30, Frequency (Times/Week): 3, Weekly Exercise (Minutes/Week): 90, Intensity: Mild, Exercise limited by: None identified   Goals Addressed             This Visit's Progress    DIET - INCREASE WATER INTAKE         Depression Screen PHQ 2/9 Scores 06/14/2021 06/12/2021 06/10/2021 06/10/2021 06/21/2020  PHQ  - 2 Score 0 0 0 0 1    Fall Risk Fall Risk  06/10/2021 06/21/2020  Falls in the past year? 0 0  Number falls in past yr: 0 0  Injury with Fall? 0 -  Follow up Falls evaluation completed -    FALL RISK PREVENTION PERTAINING TO THE HOME:  Any stairs in or around the home? No  If so, are there any without handrails? No  Home free of loose throw rugs in walkways, pet beds, electrical cords, etc? Yes  Adequate lighting in your home to reduce risk of falls? Yes  ASSISTIVE DEVICES UTILIZED TO PREVENT FALLS:  Life alert? No  Use of a cane, walker or w/c? No  Grab bars in the bathroom? No  Shower chair or bench in shower? No  Elevated toilet seat or a handicapped toilet? No   TIMED UP AND GO:  Was the test performed? Yes .  Length of time to ambulate 10 feet: 7 sec.   Gait steady and fast without use of assistive device  Cognitive Function:    Normal cognitive status assessed by direct observation by this Nurse Health Advisor. No abnormalities found.      Immunizations Immunization History  Administered Date(s) Administered   Fluad Quad(high Dose 65+) 06/14/2020   Moderna Sars-Covid-2 Vaccination 11/15/2019, 12/13/2019    TDAP status: Due, Education has been provided regarding the importance of this vaccine. Advised may receive this vaccine at local pharmacy or Health Dept. Aware to provide a copy of the vaccination record if obtained from local pharmacy or Health Dept. Verbalized acceptance and understanding.  Flu Vaccine status: Up to date  Pneumococcal vaccine status: Up to date  Covid-19 vaccine status: Completed vaccines  Qualifies for Shingles Vaccine? Yes   Zostavax completed No   Shingrix Completed?: No.    Education has been provided regarding the importance of this vaccine. Patient has been advised to call insurance company to determine out of pocket expense if they have not yet received this vaccine. Advised may also receive vaccine at local pharmacy or Health  Dept. Verbalized acceptance and understanding.  Screening Tests Health Maintenance  Topic Date Due   TETANUS/TDAP  Never done   Zoster Vaccines- Shingrix (1 of 2) Never done   COVID-19 Vaccine (3 - Moderna risk series) 01/10/2020   DEXA SCAN  Never done   INFLUENZA VACCINE  04/08/2021   MAMMOGRAM  10/19/2022   COLONOSCOPY (Pts 45-35yr Insurance coverage will need to be confirmed)  09/09/2027   Hepatitis C Screening  Completed   HPV VACCINES  Aged Out    Health Maintenance  Health Maintenance Due  Topic Date Due   TETANUS/TDAP  Never done   Zoster Vaccines- Shingrix (1 of 2) Never done   COVID-19 Vaccine (3 - Moderna risk series) 01/10/2020   DEXA SCAN  Never done   INFLUENZA VACCINE  04/08/2021    Colorectal cancer screening: Type of screening: Colonoscopy. Completed 09/08/2017. Repeat every 10 years  Mammogram status: Completed 04/025/2022. Repeat every year  Bone Density status: Ordered 06/10/2021. Pt provided with contact info and advised to call to schedule appt.  Lung Cancer Screening: (Low Dose CT Chest recommended if Age 66-80years, 30 pack-year currently smoking OR have quit w/in 15years.) does not qualify.   Lung Cancer Screening Referral: n/a  Additional Screening:  Hepatitis C Screening: does not qualify;   Vision Screening: Recommended annual ophthalmology exams for early detection of glaucoma and other disorders of the eye. Is the patient up to date with their annual eye exam?  Yes  Who is the provider or what is the name of the office in which the patient attends annual eye exams? Dr.Gould If pt is not established with a provider, would they like to be referred to a provider to establish care? No .   Dental Screening: Recommended annual dental exams for proper oral hygiene  Community Resource Referral / Chronic Care Management: CRR required this visit?  No   CCM required this visit?  No      Plan:     I have personally reviewed  and noted the  following in the patient's chart:   Medical and social history Use of alcohol, tobacco or illicit drugs  Current medications and supplements including opioid prescriptions. Patient is not currently taking opioid prescriptions. Functional ability and status Nutritional status Physical activity Advanced directives List of other physicians Hospitalizations, surgeries, and ER visits in previous 12 months Vitals Screenings to include cognitive, depression, and falls Referrals and appointments  In addition, I have reviewed and discussed with patient certain preventive protocols, quality metrics, and best practice recommendations. A written personalized care plan for preventive services as well as general preventive health recommendations were provided to patient.     Randel Pigg, LPN   [id number]   Nurse Notes: none

## 2021-07-11 ENCOUNTER — Other Ambulatory Visit: Payer: Self-pay

## 2021-07-12 ENCOUNTER — Encounter: Payer: Self-pay | Admitting: Adult Health

## 2021-07-12 ENCOUNTER — Ambulatory Visit (INDEPENDENT_AMBULATORY_CARE_PROVIDER_SITE_OTHER): Payer: Medicare Other | Admitting: Adult Health

## 2021-07-12 VITALS — BP 110/60 | HR 63 | Temp 98.4°F | Ht 64.5 in | Wt 114.0 lb

## 2021-07-12 DIAGNOSIS — Z0000 Encounter for general adult medical examination without abnormal findings: Secondary | ICD-10-CM | POA: Diagnosis not present

## 2021-07-12 DIAGNOSIS — F419 Anxiety disorder, unspecified: Secondary | ICD-10-CM | POA: Diagnosis not present

## 2021-07-12 DIAGNOSIS — Z23 Encounter for immunization: Secondary | ICD-10-CM

## 2021-07-12 LAB — CBC WITH DIFFERENTIAL/PLATELET
Basophils Absolute: 0 10*3/uL (ref 0.0–0.1)
Basophils Relative: 0.7 % (ref 0.0–3.0)
Eosinophils Absolute: 0.1 10*3/uL (ref 0.0–0.7)
Eosinophils Relative: 1.8 % (ref 0.0–5.0)
HCT: 37.6 % (ref 36.0–46.0)
Hemoglobin: 12.3 g/dL (ref 12.0–15.0)
Lymphocytes Relative: 38.6 % (ref 12.0–46.0)
Lymphs Abs: 2.7 10*3/uL (ref 0.7–4.0)
MCHC: 32.8 g/dL (ref 30.0–36.0)
MCV: 99.4 fl (ref 78.0–100.0)
Monocytes Absolute: 0.6 10*3/uL (ref 0.1–1.0)
Monocytes Relative: 7.9 % (ref 3.0–12.0)
Neutro Abs: 3.6 10*3/uL (ref 1.4–7.7)
Neutrophils Relative %: 51 % (ref 43.0–77.0)
Platelets: 339 10*3/uL (ref 150.0–400.0)
RBC: 3.78 Mil/uL — ABNORMAL LOW (ref 3.87–5.11)
RDW: 14.2 % (ref 11.5–15.5)
WBC: 7.1 10*3/uL (ref 4.0–10.5)

## 2021-07-12 LAB — TSH: TSH: 2.17 u[IU]/mL (ref 0.35–5.50)

## 2021-07-12 LAB — COMPREHENSIVE METABOLIC PANEL
ALT: 10 U/L (ref 0–35)
AST: 17 U/L (ref 0–37)
Albumin: 4.6 g/dL (ref 3.5–5.2)
Alkaline Phosphatase: 29 U/L — ABNORMAL LOW (ref 39–117)
BUN: 12 mg/dL (ref 6–23)
CO2: 29 mEq/L (ref 19–32)
Calcium: 9.6 mg/dL (ref 8.4–10.5)
Chloride: 101 mEq/L (ref 96–112)
Creatinine, Ser: 0.73 mg/dL (ref 0.40–1.20)
GFR: 85.85 mL/min (ref >60.00)
Glucose, Bld: 92 mg/dL (ref 70–99)
Potassium: 4.5 mEq/L (ref 3.5–5.1)
Sodium: 137 mEq/L (ref 135–145)
Total Bilirubin: 0.4 mg/dL (ref 0.2–1.2)
Total Protein: 7.2 g/dL (ref 6.0–8.3)

## 2021-07-12 LAB — LIPID PANEL
Cholesterol: 192 mg/dL (ref 0–200)
HDL: 79.8 mg/dL (ref >39.00)
LDL Cholesterol: 99 mg/dL (ref 0–99)
NonHDL: 112.68
Total CHOL/HDL Ratio: 2
Triglycerides: 66 mg/dL (ref 0.0–149.0)
VLDL: 13.2 mg/dL (ref 0.0–40.0)

## 2021-07-12 MED ORDER — ALPRAZOLAM 1 MG PO TABS: 1.0000 mg | ORAL_TABLET | Freq: Every day | ORAL | 2 refills | Status: DC | PRN

## 2021-07-12 NOTE — Progress Notes (Signed)
Subjective:    Patient ID: Melissa Holmes, female    DOB: Jul 26, 1956, 65 y.o.   MRN: [id number]  HPI Patient presents for yearly preventative medicine examination. She is a pleasant 64 year old female who  has a past medical history of Allergy, Colon polyps, History of chicken pox, and Ovarian cancer ([place]).  H/o Ovarian Cancer -diagnosed in 2019.  Had a hysterectomy in 2019.  Is being followed by a GYN oncology. She will be seen every 6 months until December 2024.   Anxiety-takes Xanax 1 mg as needed   All immunizations and health maintenance protocols were reviewed with the patient and needed orders were placed. She is due for influenza, pneumonia, covid booster, and shingles vaccination. Will do Prevnar 20 and and Influenza today   Appropriate screening laboratory values were ordered for the patient including screening of hyperlipidemia, renal function and hepatic function.  Medication reconciliation,  past medical history, social history, problem list and allergies were reviewed in detail with the patient  Goals were established with regard to weight loss, exercise, and  diet in compliance with medications. She has started going to the gym more often. Eats healthy   Wt Readings from Last 3 Encounters:  07/12/21 114 lb (51.7 kg)  06/10/21 111 lb (50.3 kg)  02/26/21 109 lb 12.8 oz (49.8 kg)   Has no acute issues   Review of Systems  Constitutional: Negative.   HENT: Negative.    Eyes: Negative.   Respiratory: Negative.    Cardiovascular: Negative.   Gastrointestinal: Negative.   Endocrine: Negative.   Genitourinary: Negative.   Musculoskeletal: Negative.   Skin: Negative.   Allergic/Immunologic: Negative.   Neurological: Negative.   Hematological: Negative.   Psychiatric/Behavioral: Negative.     Past Medical History:  Diagnosis Date   Allergy    Colon polyps    history of precancerous   History of chicken pox    Ovarian cancer ([place])    diagnosed 2 years  ago-treated by a physician in [place] History   Marital status: Married    Spouse name: Not on file   Number of children: Not on file   Years of education: Not on file   Highest education level: Not on file  Occupational History   Not on file  Tobacco Use   Smoking status: Some Days   Smokeless tobacco: Never  Vaping Use   Vaping Use: Never used  Substance and Sexual Activity   Alcohol use: Yes   Drug use: Not Currently   Sexual activity: Not Currently  Other Topics Concern   Not on file  Social History Narrative   Not on file   Social Determinants of Health   Financial Resource Strain: Low Risk    Difficulty of Paying Living Expenses: Not hard at all  Food Insecurity: No Food Insecurity   Worried About Charity fundraiser in the Last Year: Never true   [place] in the Last Year: Never true  Transportation Needs: No Transportation Needs   Lack of Transportation (Medical): No   Lack of Transportation (Non-Medical): No  Physical Activity: Sufficiently Active   Days of Exercise per Week: 3 days   Minutes of Exercise per Session: 60 min  Stress: No Stress Concern Present   Feeling of Stress : Not at all  Social Connections: Moderately Integrated   Frequency of Communication with Friends and Family: Twice a week   Frequency of Social  Gatherings with Friends and Family: Twice a week   Attends Religious Services: Never   Marine scientist or Organizations: Yes   Attends Music therapist: More than 4 times per year   Marital Status: Married  Human resources officer Violence: Not At Risk   Fear of Current or Ex-Partner: No   Emotionally Abused: No   Physically Abused: No   Sexually Abused: No    Past Surgical History:  Procedure Laterality Date   ABDOMINAL HYSTERECTOMY  2019   APPENDECTOMY  2019   BREAST BIOPSY Right 2019   benign per patient   TONSILLECTOMY  1965    Family History  Problem Relation Age of  Onset   Arthritis Mother    Diabetes Mother    Hearing loss Mother    Hypertension Mother    Hyperlipidemia Mother    Depression Father    Heart disease Father    Heart attack Father    Multiple myeloma Brother    Early death Brother    Hearing loss Maternal Grandmother    Alcohol abuse Maternal Grandfather    Cancer Maternal Grandfather    Cancer Paternal Grandmother    Early death Paternal 83 / Stillbirths Paternal Grandmother    Diabetes Paternal Grandfather     No Known Allergies  Current Outpatient Medications on File Prior to Visit  Medication Sig Dispense Refill   ALPRAZolam (XANAX) 1 MG tablet Take 1 tablet (1 mg total) by mouth daily as needed for anxiety. 20 tablet 2   ASCORBIC ACID PO Take by mouth daily in the afternoon.     Cholecalciferol (D3 VITAMIN PO) Take by mouth daily in the afternoon.     Lactobacillus (PROBIOTIC ACIDOPHILUS PO) Take by mouth daily in the afternoon.     No current facility-administered medications on file prior to visit.    There were no vitals taken for this visit.       Objective:   Physical Exam Vitals and nursing note reviewed.  Constitutional:      General: She is not in acute distress.    Appearance: Normal appearance. She is well-developed. She is not ill-appearing.  HENT:     Head: Normocephalic and atraumatic.     Right Ear: Tympanic membrane, ear canal and external ear normal. There is no impacted cerumen.     Left Ear: Tympanic membrane, ear canal and external ear normal. There is no impacted cerumen.     Nose: Nose normal. No congestion or rhinorrhea.     Mouth/Throat:     Mouth: Mucous membranes are moist.     Pharynx: Oropharynx is clear. No oropharyngeal exudate or posterior oropharyngeal erythema.  Eyes:     General:        Right eye: No discharge.        Left eye: No discharge.     Extraocular Movements: Extraocular movements intact.     Conjunctiva/sclera: Conjunctivae normal.      Pupils: Pupils are equal, round, and reactive to light.  Neck:     Thyroid: No thyromegaly.     Vascular: No carotid bruit.     Trachea: No tracheal deviation.  Cardiovascular:     Rate and Rhythm: Normal rate and regular rhythm.     Pulses: Normal pulses.     Heart sounds: Normal heart sounds. No murmur heard.   No friction rub. No gallop.  Pulmonary:     Effort: Pulmonary effort is normal. No respiratory distress.  Breath sounds: Normal breath sounds. No stridor. No wheezing, rhonchi or rales.  Chest:     Chest wall: No tenderness.  Abdominal:     General: Abdomen is flat. Bowel sounds are normal. There is no distension.     Palpations: Abdomen is soft. There is no mass.     Tenderness: There is no abdominal tenderness. There is no right CVA tenderness, left CVA tenderness, guarding or rebound.     Hernia: No hernia is present.  Musculoskeletal:        General: No swelling, tenderness, deformity or signs of injury. Normal range of motion.     Cervical back: Normal range of motion and neck supple.     Right lower leg: No edema.     Left lower leg: No edema.  Lymphadenopathy:     Cervical: No cervical adenopathy.  Skin:    General: Skin is warm and dry.     Coloration: Skin is not jaundiced or pale.     Findings: No bruising, erythema, lesion or rash.  Neurological:     General: No focal deficit present.     Mental Status: She is alert and oriented to person, place, and time.     Cranial Nerves: No cranial nerve deficit.     Sensory: No sensory deficit.     Motor: No weakness.     Coordination: Coordination normal.     Gait: Gait normal.     Deep Tendon Reflexes: Reflexes normal.  Psychiatric:        Mood and Affect: Mood normal.        Behavior: Behavior normal.        Thought Content: Thought content normal.        Judgment: Judgment normal.      Assessment & Plan:  1. Routine general medical examination at a health care facility - Benign exam - Follow up in one  year or sooner if needed - Continue to eat healthy and exercise  - CBC with Differential/Platelet; Future - Comprehensive metabolic panel; Future - Lipid panel; Future - TSH; Future  2. Anxiety  - ALPRAZolam (XANAX) 1 MG tablet; Take 1 tablet (1 mg total) by mouth daily as needed for anxiety.  Dispense: 20 tablet; Refill: 2   3. Need for immunization against influenza  - Flu Vaccine QUAD High Dose(Fluad)  4. Need for pneumococcal vaccination  - Pneumococcal conjugate vaccine 20-valent (Prevnar 20)  Dorothyann Peng, NP

## 2021-07-12 NOTE — Patient Instructions (Signed)
It was great seeing you today   We will follow up with you regarding your blood work   I will see you back in one year or sooner if needed 

## 2021-09-04 NOTE — Progress Notes (Signed)
Gynecologic Oncology Follow Up  Consult was requested by Sallee Provencal for the evaluation of Melissa Holmes 65 y.o. 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  Assessment/Plan: Melissa Holmes is a 65 y.o. 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  She is NED on today's exam. She will be contacted with the results of her CA 125 drawn today. Signs and symptoms of recurrence given in after visit summary. Follow up recommendations include office visits at 6 monthly intervals for surveillance exams and CA 125 assessments until December, 2024. Information given about pelvic floor physical therapy and she will contact our office if she is interested in a referral.   HPI: Melissa Holmes is a 65 year old female P0 initially seen in consultation at the request of Sallee Provencal NP for evaluation of a history of right ovarian cancer diagnosed in 2019.  Per Dr. Denman George: In September 2019 when living in [place], she developed right lower quadrant pain and GI distress. She was seen in the emergency department and was operated on with a laparoscopic appendectomy on Labor Day weekend.  Approximately 1 month following the surgery, she developed new right lower quadrant pain and indigestion and was seen again in the emergency department. At this time, she was diagnosed with a right ovarian cystic mass that measured approximately 8 cm and was complex cyst, cystic and solid.  She was taken to the operating room for laparoscopic BSO on July 01, 2018.  Pathology from this specimen revealed a FIGO grade 1 endometrioid adenocarcinoma of the right ovary with no surface involvement.  She met with Dr. Kristen Loader at [facility] oncology in [facility] for definitive surgical staging that was performed on 07/21/2018.  This included a robotic  hysterectomy, pelvic and para-aortic lymphadenectomy, omentectomy, peritoneal biopsies.  No residual carcinoma or metastatic carcinoma was identified in any of the specimens.  Complex atypical hyperplasia was noted within the endometrium with no invasion present.  This was felt to be unrelated to her ovarian carcinoma.  She received definitive staging is a stage Ia grade 1 endometrioid adenocarcinoma of the right ovary.  She reported undergoing genetic testing at [facility] in [facility] at the cancer center.  She reported this was negative for BRCA mutations.  No adjuvant therapy was recommended due to her early stage low-grade lesion.  She did not present for typical in person follow-up due to the emergence of the COVID-19 pandemic.  However she did intermittently have virtual visits with a general medical oncologist and had Ca1 25 lab evaluations at 3 monthly intervals.  She relocated from [place] to [place] in the summer 2021 in order to be closer in proximity to her 48 year old mother.  At that time she established care with Sallee Provencal NP.  Ca1 25 was drawn on 08/09/2020 and was normal at 9.  Interval Hx: She has no symptoms concerning for recurrence. She states she has been doing well since her last visit. Tolerating diet with no nausea or emesis. Has intermittent right sided headaches which began after having shingles at the age of 64. No non-age related changes to her vision. Has intermittent abdominal bloating, which she relates to her dietary choices. This is relieved with dietary changes and using medications if needed. No early satiety. No issues emptying her bladder. No vaginal discharge or bleeding reported.  She has intermittent pains in her left foot and legs with no edema reported. She remains very active. Reports decreased libido and states intercourse is not comfortable. Questions answered in regards to age related/hormonal changes. She had a good Christmas and  was able to spend it with her mother. Asking if she needs to get estrablished with a gyn for future care. She is trying to find a new yoga instructor in [place] and feels this is very therapeutic and healing for her body.  Current Meds:  Outpatient Encounter Medications as of 09/05/2021  Medication Sig   ALPRAZolam (XANAX) 1 MG tablet Take 1 tablet (1 mg total) by mouth daily as needed for anxiety.   ASCORBIC ACID PO Take by mouth daily in the afternoon.   BINAXNOW COVID-19 AG HOME TEST KIT TEST AS DIRECTED TODAY   Cholecalciferol (D3 VITAMIN PO) Take by mouth daily in the afternoon.   Lactobacillus (PROBIOTIC ACIDOPHILUS PO) Take by mouth daily in the afternoon. (Patient not taking: Reported on 09/05/2021)   No facility-administered encounter medications on file as of 09/05/2021.    Allergy: No Known Allergies  Social Hx:   Social History   Socioeconomic History   Marital status: Married    Spouse name: Not on file   Number of children: Not on file   Years of education: Not on file   Highest education level: Not on file  Occupational History   Not on file  Tobacco Use   Smoking status: Some Days   Smokeless tobacco: Never  Vaping Use   Vaping Use: Never used  Substance and Sexual Activity   Alcohol use: Yes   Drug use: Not Currently   Sexual activity: Not Currently  Other Topics Concern   Not on file  Social History Narrative   Not on file   Social Determinants of Health   Financial Resource Strain: Low Risk    Difficulty of Paying Living Expenses: Not hard at all  Food Insecurity: No Food Insecurity   Worried About Charity fundraiser in the Last Year: Never true   [place] in the Last Year: Never true  Transportation Needs: No Transportation Needs   Lack of Transportation (Medical): No   Lack of Transportation (Non-Medical): No  Physical Activity: Sufficiently Active   Days of Exercise per Week: 3 days   Minutes of Exercise per Session: 60 min   Stress: No Stress Concern Present   Feeling of Stress : Not at all  Social Connections: Moderately Integrated   Frequency of Communication with Friends and Family: Twice a week   Frequency of Social Gatherings with Friends and Family: Twice a week   Attends Religious Services: Never   Marine scientist or Organizations: Yes   Attends Music therapist: More than 4 times per year   Marital Status: Married  Human resources officer Violence: Not At Risk   Fear of Current or Ex-Partner: No   Emotionally Abused: No   Physically Abused: No   Sexually Abused: No    Past Surgical Hx:  Past Surgical History:  Procedure Laterality Date   ABDOMINAL HYSTERECTOMY  2019   APPENDECTOMY  2019   BREAST BIOPSY Right 2019   benign per patient   TONSILLECTOMY  1965    Past Medical Hx:  Past Medical History:  Diagnosis Date   Allergy    Colon polyps    history of precancerous   History of chicken pox    Ovarian cancer ([place])  diagnosed 2 years ago-treated by a physician in [place]    Past Gynecological History:  See HPI, G0 No LMP recorded. Patient has had a hysterectomy.  Family Hx:  Family History  Problem Relation Age of Onset   Arthritis Mother    Diabetes Mother    Hearing loss Mother    Hypertension Mother    Hyperlipidemia Mother    Depression Father    Heart disease Father    Heart attack Father    Multiple myeloma Brother    Early death Brother    Hearing loss Maternal Grandmother    Alcohol abuse Maternal Grandfather    Cancer Maternal Grandfather    Cancer Paternal Grandmother    Early death Paternal Grandmother    Miscarriages / Stillbirths Paternal Grandmother    Diabetes Paternal Grandfather     Review of Systems: Constitutional: Feels well. No unintentional weight loss. No early satiety.    ENT: Normal appearing ears and nares bilaterally Skin/Breast: No rash, sores, jaundice, itching, dryness Cardiovascular: No chest pain, shortness of breath,  or edema  Pulmonary: No cough or wheeze.  Gastro Intestinal: No nausea, vomiting, or diarrhea. No bright red blood per rectum, no abdominal pain, change in bowel movement, or constipation.  Genito Urinary: No frequency, urgency, dysuria, no bleeding Musculo Skeletal: No new myalgia, arthralgia, joint swelling or pain  Neurologic: No weakness, numbness, change in gait,   Vitals:  Blood pressure 111/64, pulse 64, temperature 98 F (36.7 C), temperature source Oral, resp. rate 18, height '5\' 4"'  (1.626 m), weight 116 lb 12.8 oz (53 kg), SpO2 100 %.  Physical Exam: General: Well developed, well nourished female in no acute distress. Alert and oriented x 3.  Neck: Supple without any enlargements.  Lymph node survey: No cervical, supraclavicular, or inguinal adenopathy.  Cardiovascular: Regular rate and rhythm. S1 and S2 normal.  Lungs: Clear to auscultation bilaterally. No wheezes/crackles/rhonchi noted.  Skin: No rashes or lesions present. Back: No CVA tenderness.  Abdomen: Abdomen soft, non-tender and non-obese. Active bowel sounds in all quadrants. No evidence of a fluid wave or abdominal masses.  Genitourinary:    Vulva/vagina: Normal external female genitalia. No lesions.    Urethra: No lesions or masses    Vagina: Atrophic without any lesions. No palpable masses. No vaginal bleeding or drainage noted.  Rectal: Good tone, no masses, no cul de sac nodularity.  Extremities: No bilateral cyanosis, edema, or clubbing.    Dorothyann Gibbs, NP  09/06/2021, 7:41 AM

## 2021-09-04 NOTE — Patient Instructions (Addendum)
Your exam today is normal. We will contact you with the results of your CA 125 from today. Plan to follow up in six months with repeat CA 125 lab draw at that time or sooner if needed.   Symptoms to report to your health care team include vaginal bleeding, rectal bleeding, bloating, weight loss without effort, new and persistent pain, new and  persistent fatigue, new leg swelling, new masses (i.e., bumps in your neck or groin), new and persistent cough, new and persistent nausea and vomiting, change in bowel or bladder habits, and any other concerns.   Please let us know if you would be interested in meeting with the pelvic floor physical therapist. Also if your headaches worsen or you would like to meet with a neurologist about this, please let us know and we can place a referral.   With intercourse, we would recommend using lubrication.

## 2021-09-05 ENCOUNTER — Inpatient Hospital Stay: Payer: Medicare Other | Attending: Gynecologic Oncology | Admitting: Gynecologic Oncology

## 2021-09-05 ENCOUNTER — Inpatient Hospital Stay: Payer: Medicare Other

## 2021-09-05 ENCOUNTER — Other Ambulatory Visit: Payer: Self-pay

## 2021-09-05 ENCOUNTER — Encounter: Payer: Self-pay | Admitting: Gynecologic Oncology

## 2021-09-05 VITALS — BP 111/64 | HR 64 | Temp 98.0°F | Resp 18 | Ht 64.0 in | Wt 116.8 lb

## 2021-09-05 DIAGNOSIS — R6882 Decreased libido: Secondary | ICD-10-CM | POA: Diagnosis not present

## 2021-09-05 DIAGNOSIS — Z8543 Personal history of malignant neoplasm of ovary: Secondary | ICD-10-CM | POA: Diagnosis not present

## 2021-09-05 DIAGNOSIS — Z79899 Other long term (current) drug therapy: Secondary | ICD-10-CM | POA: Insufficient documentation

## 2021-09-05 DIAGNOSIS — Z90722 Acquired absence of ovaries, bilateral: Secondary | ICD-10-CM | POA: Diagnosis not present

## 2021-09-05 DIAGNOSIS — C569 Malignant neoplasm of unspecified ovary: Secondary | ICD-10-CM

## 2021-09-05 DIAGNOSIS — Z90710 Acquired absence of both cervix and uterus: Secondary | ICD-10-CM | POA: Insufficient documentation

## 2021-09-05 DIAGNOSIS — N941 Unspecified dyspareunia: Secondary | ICD-10-CM | POA: Insufficient documentation

## 2021-09-05 DIAGNOSIS — R519 Headache, unspecified: Secondary | ICD-10-CM | POA: Insufficient documentation

## 2021-09-06 ENCOUNTER — Telehealth: Payer: Self-pay

## 2021-09-06 LAB — CA 125: Cancer Antigen (CA) 125: 9.2 U/mL (ref 0.0–38.1)

## 2021-09-06 NOTE — Telephone Encounter (Signed)
Spoke with Melissa Holmes this morning regarding her CA 125 results. Pt informed that the lab result was stable and within normal range. Pt verbalized understanding.

## 2021-11-06 ENCOUNTER — Other Ambulatory Visit: Payer: Self-pay | Admitting: Adult Health

## 2021-11-06 DIAGNOSIS — F419 Anxiety disorder, unspecified: Secondary | ICD-10-CM

## 2021-11-06 NOTE — Telephone Encounter (Signed)
Okay for refill?  ? ? ?LOV 07/12/2021 ? ? ?Last Refill      ? ?ALPRAZolam (XANAX) 1 MG tablet 20 tablet 2 07/12/2021   ?Sig:   Take 1 tablet (1 mg total) by mouth daily as needed for anxiety.      ? ?

## 2021-12-06 ENCOUNTER — Other Ambulatory Visit: Payer: Self-pay | Admitting: Adult Health

## 2021-12-06 DIAGNOSIS — Z1231 Encounter for screening mammogram for malignant neoplasm of breast: Secondary | ICD-10-CM

## 2021-12-17 ENCOUNTER — Ambulatory Visit
Admission: RE | Admit: 2021-12-17 | Discharge: 2021-12-17 | Disposition: A | Discharge status: Home / Self Care | Payer: Medicare Other | Source: Ambulatory Visit | Attending: Adult Health | Admitting: Adult Health

## 2021-12-17 DIAGNOSIS — Z1231 Encounter for screening mammogram for malignant neoplasm of breast: Secondary | ICD-10-CM | POA: Diagnosis not present

## 2021-12-30 DIAGNOSIS — H5203 Hypermetropia, bilateral: Secondary | ICD-10-CM | POA: Diagnosis not present

## 2021-12-30 DIAGNOSIS — H353131 Nonexudative age-related macular degeneration, bilateral, early dry stage: Secondary | ICD-10-CM | POA: Diagnosis not present

## 2022-02-05 ENCOUNTER — Encounter: Payer: Self-pay | Admitting: Gynecologic Oncology

## 2022-02-11 ENCOUNTER — Other Ambulatory Visit: Payer: Medicare Other

## 2022-02-11 ENCOUNTER — Ambulatory Visit: Payer: Medicare Other | Admitting: Gynecologic Oncology

## 2022-02-11 ENCOUNTER — Inpatient Hospital Stay: Payer: Medicare Other | Attending: Gynecologic Oncology | Admitting: Gynecologic Oncology

## 2022-02-11 ENCOUNTER — Inpatient Hospital Stay: Payer: Medicare Other

## 2022-02-11 ENCOUNTER — Other Ambulatory Visit: Payer: Self-pay

## 2022-02-11 VITALS — BP 97/65 | HR 67 | Temp 98.4°F | Resp 16 | Ht 64.0 in | Wt 117.0 lb

## 2022-02-11 DIAGNOSIS — C569 Malignant neoplasm of unspecified ovary: Secondary | ICD-10-CM | POA: Diagnosis not present

## 2022-02-11 DIAGNOSIS — Z8543 Personal history of malignant neoplasm of ovary: Secondary | ICD-10-CM | POA: Diagnosis present

## 2022-02-11 NOTE — Patient Instructions (Addendum)
Your exam today is normal. We will contact you with the results of your CA 125 from today. Plan to follow up in six months with repeat CA 125 lab draw at that time or sooner if needed.   Try to incorporate exercises in between reading book chapters. You can add wrist or ankle weights if you would like to add extra resistance. Jumping rope sounds like a great idea too.    Symptoms to report to your health care team include vaginal bleeding, rectal bleeding, bloating, weight loss without effort, new and persistent pain, new and  persistent fatigue, new leg swelling, new masses (i.e., bumps in your neck or groin), new and persistent cough, new and persistent nausea and vomiting, change in bowel or bladder habits, and any other concerns.

## 2022-02-11 NOTE — Progress Notes (Signed)
Gynecologic Oncology Follow Up  Consult was requested by Sallee Provencal for the evaluation of Melissa Holmes 66 y.o. 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  Assessment/Plan: Anikka Marsan is a 66 y.o. 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  No evidence of recurrence/disease on today's exam. She will be contacted with the results of her CA 125 drawn today. Signs and symptoms of recurrence given in after visit summary. Follow up recommendations include office visits at 6 monthly intervals for surveillance exams and CA 125 assessments until December, 2024.  We discussed incorporating exercises in between reading book chapters and different ways to incorporate physical activity during her day. She is advised to call for any needs, concerns, or new symptoms.   HPI: Melissa Holmes is a 66 year old female P0 initially seen in consultation at the request of Sallee Provencal NP for evaluation of a history of right ovarian cancer diagnosed in 2019.  Per Dr. Denman George: In September 2019 when living in [place], she developed right lower quadrant pain and GI distress. She was seen in the emergency department and was operated on with a laparoscopic appendectomy on Labor Day weekend.  Approximately 1 month following the surgery, she developed new right lower quadrant pain and indigestion and was seen again in the emergency department. At this time, she was diagnosed with a right ovarian cystic mass that measured approximately 8 cm and was complex cyst, cystic and solid.  She was taken to the operating room for laparoscopic BSO on July 01, 2018.  Pathology from this specimen revealed a FIGO grade 1 endometrioid adenocarcinoma of the right ovary with no surface involvement.  She met with Dr. Kristen Loader at [facility] oncology in  [facility] for definitive surgical staging that was performed on 07/21/2018.  This included a robotic hysterectomy, pelvic and para-aortic lymphadenectomy, omentectomy, peritoneal biopsies.  No residual carcinoma or metastatic carcinoma was identified in any of the specimens.  Complex atypical hyperplasia was noted within the endometrium with no invasion present.  This was felt to be unrelated to her ovarian carcinoma.  She received definitive staging as a Stage Ia grade 1 endometrioid adenocarcinoma of the right ovary.  She reported undergoing genetic testing at [facility] in [place] at the cancer center.  She reported this was negative for BRCA mutations.  No adjuvant therapy was recommended due to her early stage low-grade lesion.  She did not present for typical in person follow-up due to the emergence of the COVID-19 pandemic.  However she did intermittently have virtual visits with a general medical oncologist and had Ca 125 lab evaluations at 3 monthly intervals.  She relocated from [place] to [place] in the summer 2021 in order to be closer in proximity to her elderly mother.  At that time she established care with Sallee Provencal NP. Ca 125 was last drawn on 09/05/2021 and was normal at 9.2.  Interval Hx: She states she has been doing well since her last visit with no symptoms concerning for recurrence. She is currently working for the [employer] and [employer]. She has been doing a lot of reading and enjoys this very much. She states she has gained 15 pounds since her original diagnosis in 2019.  She is no longer doing yoga and would like to lose 1 to 2 inches  on her waistline.  She is tolerating her diet with no nausea or emesis.  She reports intermittent bloating symptoms which she relates to her dietary choices.  No abdominal pain reported. Denies chest pain, dyspnea, new cough.  Bowels and bladder functioning without difficulty. No vaginal  bleeding or discharge reported.  She recently signed up for a new diet program called [employer]. She has not started this yet but it is a challenge given that most of the information is digital.  She continues to visit her mother frequently.  Reporting intermittent leg and feet soreness after standing for 8 hours while working at [employer] but has recently bought new shoes to hopefully help.  No lower extremity edema reported.  No significant headaches reported and continues to report intermittent twinges in the right occipital region.  Current Meds:  Outpatient Encounter Medications as of 02/11/2022  Medication Sig   ALPRAZolam (XANAX) 1 MG tablet TAKE 1 TABLET(1 MG) BY MOUTH DAILY AS NEEDED FOR ANXIETY   ASCORBIC ACID PO Take by mouth daily in the afternoon.   Cholecalciferol (D3 VITAMIN PO) Take by mouth daily in the afternoon.   Lactobacillus (PROBIOTIC ACIDOPHILUS PO) Take by mouth daily in the afternoon.   Lysine 1000 MG TABS Take by mouth.   BINAXNOW COVID-19 AG HOME TEST KIT TEST AS DIRECTED TODAY (Patient not taking: Reported on 02/05/2022)   No facility-administered encounter medications on file as of 02/11/2022.    Allergy: No Known Allergies  Social Hx:   Social History   Socioeconomic History   Marital status: Married    Spouse name: Not on file   Number of children: Not on file   Years of education: Not on file   Highest education level: Not on file  Occupational History   Not on file  Tobacco Use   Smoking status: Some Days   Smokeless tobacco: Never  Vaping Use   Vaping Use: Never used  Substance and Sexual Activity   Alcohol use: Yes    Comment: Occ wine   Drug use: Not Currently   Sexual activity: Not Currently  Other Topics Concern   Not on file  Social History Narrative   Not on file   Social Determinants of Health   Financial Resource Strain: Low Risk    Difficulty of Paying Living Expenses: Not hard at all  Food Insecurity: No Food Insecurity    Worried About Charity fundraiser in the Last Year: Never true   [place] in the Last Year: Never true  Transportation Needs: No Transportation Needs   Lack of Transportation (Medical): No   Lack of Transportation (Non-Medical): No  Physical Activity: Sufficiently Active   Days of Exercise per Week: 3 days   Minutes of Exercise per Session: 60 min  Stress: No Stress Concern Present   Feeling of Stress : Not at all  Social Connections: Moderately Integrated   Frequency of Communication with Friends and Family: Twice a week   Frequency of Social Gatherings with Friends and Family: Twice a week   Attends Religious Services: Never   Marine scientist or Organizations: Yes   Attends Music therapist: More than 4 times per year   Marital Status: Married  Human resources officer Violence: Not At Risk   Fear of Current or Ex-Partner: No   Emotionally Abused: No   Physically Abused: No   Sexually Abused: No    Past Surgical Hx:  Past Surgical History:  Procedure  Laterality Date   ABDOMINAL HYSTERECTOMY  2019   APPENDECTOMY  2019   BREAST BIOPSY Right 2019   benign per patient   TONSILLECTOMY  1965    Past Medical Hx:  Past Medical History:  Diagnosis Date   Allergy    Colon polyps    history of precancerous   History of chicken pox    Ovarian cancer ([place])    diagnosed 2 years ago-treated by a physician in [place]    Past Gynecological History:  See HPI, G0 No LMP recorded. Patient has had a hysterectomy.  Family Hx:  Family History  Problem Relation Age of Onset   Arthritis Mother    Diabetes Mother    Hearing loss Mother    Hypertension Mother    Hyperlipidemia Mother    Depression Father    Heart disease Father    Heart attack Father    Multiple myeloma Brother    Early death Brother    Hearing loss Maternal Grandmother    Alcohol abuse Maternal Grandfather    Cancer Maternal Grandfather    Cancer Paternal Grandmother    Early death Paternal  21 / Stillbirths Paternal Grandmother    Diabetes Paternal Grandfather    Colon cancer Neg Hx    Breast cancer Neg Hx    Ovarian cancer Neg Hx    Endometrial cancer Neg Hx    Pancreatic cancer Neg Hx    Prostate cancer Neg Hx     Mammogram 12/17/21: negative Colonoscopy in 2019: negative  Review of Systems: Constitutional: Feels well. No unintentional weight loss. No early satiety.    ENT: Normal appearing ears and nares bilaterally Skin/Breast: No rash, sores, jaundice, itching, dryness Cardiovascular: No chest pain, shortness of breath, or edema  Pulmonary: No cough or wheeze.  Gastro Intestinal: No nausea, vomiting, or diarrhea. No bright red blood per rectum, no abdominal pain, change in bowel movement, or constipation.  Genito Urinary: No frequency, urgency, dysuria, no bleeding Musculo Skeletal: No new myalgia, arthralgia, joint swelling or pain  Neurologic: No weakness, numbness, change in gait   Vitals:  Blood pressure 97/65, pulse 67, temperature 98.4 F (36.9 C), temperature source Oral, resp. rate 16, height 5' 4" (1.626 m), weight 117 lb (53.1 kg), SpO2 98 %.  Physical Exam: General: Well developed, well nourished female in no acute distress. Alert and oriented x 3.  Neck: Supple without any enlargements.  Lymph node survey: No cervical, supraclavicular, or inguinal adenopathy.  Cardiovascular: Regular rate and rhythm. S1 and S2 normal.  Lungs: Clear to auscultation bilaterally. No wheezes/crackles/rhonchi noted.  Skin: No rashes or lesions present. Back: No CVA tenderness.  Abdomen: Abdomen soft, non-tender and non-obese. Active bowel sounds in all quadrants. No evidence of a fluid wave or abdominal masses.  Genitourinary:    Vulva/vagina: Normal external female genitalia. No lesions.    Urethra: No lesions or masses    Vagina: Atrophic without any lesions. No palpable masses. No vaginal bleeding or drainage noted.  Rectal: Good tone, no  masses, no cul de sac nodularity.  Extremities: No bilateral cyanosis, edema, or clubbing.    Dorothyann Gibbs, NP  02/12/2022, 12:18 PM

## 2022-02-12 ENCOUNTER — Encounter: Payer: Self-pay | Admitting: Gynecologic Oncology

## 2022-02-13 ENCOUNTER — Telehealth: Payer: Self-pay

## 2022-02-13 LAB — CA 125: Cancer Antigen (CA) 125: 9.9 U/mL (ref 0.0–38.1)

## 2022-02-13 NOTE — Telephone Encounter (Signed)
Pt aware of CA125 results being stable and within normal range

## 2022-03-04 ENCOUNTER — Other Ambulatory Visit: Payer: Self-pay | Admitting: Adult Health

## 2022-03-04 DIAGNOSIS — F419 Anxiety disorder, unspecified: Secondary | ICD-10-CM

## 2022-03-04 NOTE — Telephone Encounter (Addendum)
Pt is aware cory is out of the office and he will be back on 03-07-2022

## 2022-03-05 NOTE — Telephone Encounter (Signed)
Okay for refill?    LOV  07/12/2021   Last Refill         ALPRAZolam (XANAX) 1 MG tablet 20 tablet 2 11/06/2021   Sig:   TAKE 1 TABLET(1 MG) BY MOUTH DAILY AS NEEDED FOR ANXIETY

## 2022-05-28 ENCOUNTER — Telehealth: Payer: Self-pay

## 2022-05-28 NOTE — Telephone Encounter (Signed)
Left message for patient to call back and reschedule follow up appointment from 12/6 to 12/5.  Patient also has lab appointment that day that will need to be rescheduled as well.

## 2022-05-28 NOTE — Telephone Encounter (Signed)
Patient called back and appt moved to 12/5 at 1 pm

## 2022-06-09 ENCOUNTER — Telehealth: Payer: Self-pay | Admitting: Adult Health

## 2022-06-09 DIAGNOSIS — F419 Anxiety disorder, unspecified: Secondary | ICD-10-CM

## 2022-06-09 NOTE — Telephone Encounter (Signed)
Pt called to request a refill of the Alprazolam.  LOV:  07/12/21  Pt was scheduled for a CPE on 07/17/22.  Pt requested that NP send her just a few pills until her CPE in November.    [facility] [place], [address] AT [address] Phone:  [phone]  Fax:  [phone]

## 2022-06-10 MED ORDER — ALPRAZOLAM 1 MG PO TABS: ORAL_TABLET | ORAL | 0 refills | Status: DC

## 2022-06-11 ENCOUNTER — Ambulatory Visit (INDEPENDENT_AMBULATORY_CARE_PROVIDER_SITE_OTHER): Payer: Medicare Other

## 2022-06-11 VITALS — Ht 64.0 in | Wt 117.0 lb

## 2022-06-11 DIAGNOSIS — Z13820 Encounter for screening for osteoporosis: Secondary | ICD-10-CM | POA: Diagnosis not present

## 2022-06-11 DIAGNOSIS — Z0000 Encounter for general adult medical examination without abnormal findings: Secondary | ICD-10-CM

## 2022-06-11 NOTE — Progress Notes (Signed)
Subjective:   Melissa Holmes is a 66 y.o. female who presents for Medicare Annual (Subsequent) preventive examination.  Review of Systems    Virtual Visit via Telephone Note  I connected with  Melissa Holmes on 06/11/22 at  9:45 AM EDT by telephone and verified that I am speaking with the correct person using two identifiers.  Location: Patient: Home Provider: Office Persons participating in the virtual visit: patient/Nurse Health Advisor   I discussed the limitations, risks, security and privacy concerns of performing an evaluation and management service by telephone and the availability of in person appointments. The patient expressed understanding and agreed to proceed.  Interactive audio and video telecommunications were attempted between this nurse and patient, however failed, due to patient having technical difficulties OR patient did not have access to video capability.  We continued and completed visit with audio only.  Some vital signs may be absent or patient reported.   Criselda Peaches, LPN  Cardiac Risk Factors include: advanced age (>72mn, >>79women);Other (see comment), Risk factor comments: Dx Colon Polyps     Objective:    Today's Vitals   06/11/22 0935  Weight: 117 lb (53.1 kg)  Height: 5' 4" (1.626 m)   Body mass index is 20.08 kg/m.     06/11/2022    9:42 AM 02/05/2022   12:35 PM 09/05/2021    9:12 AM 06/10/2021    1:25 PM 10/04/2020   10:35 AM  Advanced Directives  Does Patient Have a Medical Advance Directive? _0   Does patient want to make changes to medical advance directive?     No - Patient declined  [place]?     No - copy requested  Would patient like information on creating a medical advance directive? No - Patient declined Yes (MAU/Ambulatory/Procedural Areas - Information given) Yes (MAU/Ambulatory/Procedural Areas - Information given) No - Patient declined No - Patient declined     Current Medications (verified) Outpatient Encounter Medications as of 06/11/2022  Medication Sig   ALPRAZolam (XANAX) 1 MG tablet TAKE 1 TABLET(1 MG) BY MOUTH DAILY AS NEEDED FOR ANXIETY   ASCORBIC ACID PO Take by mouth daily in the afternoon.   BINAXNOW COVID-19 AG HOME TEST KIT TEST AS DIRECTED TODAY (Patient not taking: Reported on 02/05/2022)   Cholecalciferol (D3 VITAMIN PO) Take by mouth daily in the afternoon.   Lactobacillus (PROBIOTIC ACIDOPHILUS PO) Take by mouth daily in the afternoon.   Lysine 1000 MG TABS Take by mouth.   No facility-administered encounter medications on file as of 06/11/2022.    Allergies (verified) Patient has no known allergies.   History: Past Medical History:  Diagnosis Date   Allergy    Colon polyps    history of precancerous   History of chicken pox    [place]  Past Surgical History:  Procedure Laterality Date   ABDOMINAL HYSTERECTOMY  2019   APPENDECTOMY  2019   BREAST BIOPSY Right 2019   benign per patient   TONSILLECTOMY  1965   Family History  Problem Relation Age of Onset   Arthritis Mother    Diabetes Mother    Hearing loss Mother    Hypertension Mother    Hyperlipidemia Mother    Depression Father    Heart disease Father    Heart attack Father    Multiple myeloma Brother  Early death Brother    Hearing loss Maternal Grandmother    Alcohol abuse Maternal Grandfather    Cancer Maternal Grandfather    Cancer Paternal Grandmother    Early death Paternal 64 / Stillbirths Paternal Grandmother    Diabetes Paternal Grandfather    Colon cancer Neg Hx    Breast cancer Neg Hx    Ovarian cancer Neg Hx    Endometrial cancer Neg Hx    Pancreatic cancer Neg Hx    Prostate cancer Neg Hx    Social History   Socioeconomic History   Marital status: Married    Spouse name: Not on file   Number of children: Not on file   Years  of education: Not on file   Highest education level: Not on file  Occupational History   Not on file  Tobacco Use   Smoking status: Some Days   Smokeless tobacco: Never  Vaping Use   Vaping Use: Never used  Substance and Sexual Activity   Alcohol use: Yes    Comment: Occ wine   Drug use: Not Currently   Sexual activity: Not Currently  Other Topics Concern   Not on file  Social History Narrative   Not on file   Social Determinants of Health   Financial Resource Strain: Low Risk  (06/11/2022)   Overall Financial Resource Strain (CARDIA)    Difficulty of Paying Living Expenses: Not hard at all  Food Insecurity: No Food Insecurity (06/11/2022)   Hunger Vital Sign    Worried About Running Out of Food in the Last Year: Never true    Ran Out of Food in the Last Year: Never true  Transportation Needs: No Transportation Needs (06/11/2022)   PRAPARE - Hydrologist (Medical): No    Lack of Transportation (Non-Medical): No  Physical Activity: Sufficiently Active (06/11/2022)   Exercise Vital Sign    Days of Exercise per Week: 5 days    Minutes of Exercise per Session: 30 min  Stress: No Stress Concern Present (06/11/2022)   [place]    Feeling of Stress : Not at all  Social Connections: Moderately Isolated (06/11/2022)   Social Connection and Isolation Panel [NHANES]    Frequency of Communication with Friends and Family: More than three times a week    Frequency of Social Gatherings with Friends and Family: More than three times a week    Attends Religious Services: Never    Marine scientist or Organizations: No    Attends Music therapist: Never    Marital Status: Married    Tobacco Counseling Ready to quit: No Counseling given: Yes   Clinical Intake:  Pre-visit preparation completed: No  Pain : No/denies pain     BMI - recorded: 20.08 Nutritional Status:  BMI of 19-24  Normal Nutritional Risks: None Diabetes: No  How often do you need to have someone help you when you read instructions, pamphlets, or other written materials from your doctor or pharmacy?: 1 - Never  Diabetic?  No  Interpreter Needed?: No  Information entered by :: Rolene Arbour LPN   Activities of Daily Living    06/11/2022    9:40 AM 06/12/2021    4:18 PM  In your present state of health, do you have any difficulty performing the following activities:  Hearing? 0 0  Vision? 0 0  Difficulty concentrating or making decisions? 0 0  Walking or climbing stairs? 0 0  Dressing or bathing? 0 0  Doing errands, shopping? 0 0  Preparing Food and eating ? N N  Using the Toilet? N N  In the past six months, have you accidently leaked urine? N N  Do you have problems with loss of bowel control? N N  Managing your Medications? N N  Managing your Finances? N N  Housekeeping or managing your Housekeeping? N N    Patient Care Team: Dorothyann Peng, NP as PCP - General (Family Medicine)  Indicate any recent Medical Services you may have received from other than [facility] providers in the past year (date may be approximate).     Assessment:   This is a routine wellness examination for Melissa Holmes.  Hearing/Vision screen Hearing Screening - Comments:: Denies hearing difficulties   Vision Screening - Comments:: Wears reading glasses - up to date with routine eye exams with  Dr Delman Cheadle  Dietary issues and exercise activities discussed: Current Exercise Habits: Home exercise routine, Type of exercise: walking, Time (Minutes): 30, Frequency (Times/Week): 5, Weekly Exercise (Minutes/Week): 150, Intensity: Moderate, Exercise limited by: None identified   Goals Addressed               This Visit's Progress     Travel to [place] (pt-stated)         Depression Screen    06/11/2022    9:39 AM 06/14/2021    8:29 AM 06/12/2021    4:18 PM 06/10/2021    1:23 PM 06/10/2021    1:17 PM  06/21/2020    1:34 PM  PHQ 2/9 Scores  PHQ - 2 Score 0 0 0 0 0 1    Fall Risk    06/11/2022    9:41 AM 06/10/2021    1:29 PM 06/21/2020    1:33 PM  [place] in the past year? 0 0 0  Number falls in past yr: 0 0 0  Injury with Fall? 0 0   Risk for fall due to : No Fall Risks    Follow up Falls prevention discussed Falls evaluation completed     [place]:  Any stairs in or around the home? Yes  If so, are there any without handrails? No  Home free of loose throw rugs in walkways, pet beds, electrical cords, etc? Yes  Adequate lighting in your home to reduce risk of falls? Yes   ASSISTIVE DEVICES UTILIZED TO PREVENT FALLS:  Life alert? No  Use of a cane, walker or w/c? No  Grab bars in the bathroom? No  Shower chair or bench in shower? Yes  Elevated toilet seat or a handicapped toilet? No   TIMED UP AND GO:  Was the test performed? No . Audio Visit   Cognitive Function:        06/11/2022    9:43 AM  6CIT Screen  What Year? 0 points  What month? 0 points  What time? 0 points  Count back from 20 0 points  Months in reverse 0 points  Repeat phrase 0 points  Total Score 0 points    Immunizations Immunization History  Administered Date(s) Administered   Fluad Quad(high Dose 65+) 06/14/2020, 07/12/2021   Moderna Sars-Covid-2 Vaccination 11/15/2019, 12/13/2019   PNEUMOCOCCAL CONJUGATE-20 07/12/2021    TDAP status: Due, Education has been provided regarding the importance of this vaccine. Advised may receive this vaccine at local pharmacy or Health Dept. Aware to provide a  copy of the vaccination record if obtained from local pharmacy or Health Dept. Verbalized acceptance and understanding.  Flu Vaccine status: Up to date  Pneumococcal vaccine status: Up to date  Covid-19 vaccine status: Completed vaccines  Qualifies for Shingles Vaccine? Yes   Zostavax completed No   Shingrix Completed?: No.    Education has been  provided regarding the importance of this vaccine. Patient has been advised to call insurance company to determine out of pocket expense if they have not yet received this vaccine. Advised may also receive vaccine at local pharmacy or Health Dept. Verbalized acceptance and understanding.  Screening Tests Health Maintenance  Topic Date Due   DEXA SCAN  Never done   COVID-19 Vaccine (3 - Moderna risk series) 06/27/2022 (Originally 01/10/2020)   Zoster Vaccines- Shingrix (1 of 2) 09/11/2022 (Originally 05/14/1975)   INFLUENZA VACCINE  12/07/2022 (Originally 04/08/2022)   TETANUS/TDAP  06/12/2023 (Originally 05/14/1975)   MAMMOGRAM  12/18/2023   COLONOSCOPY (Pts 45-29yr Insurance coverage will need to be confirmed)  09/09/2027   Pneumonia Vaccine 66 Years old  Completed   Hepatitis C Screening  Completed   HPV VACCINES  Aged Out    Health Maintenance  Health Maintenance Due  Topic Date Due   DEXA SCAN  Never done    Colorectal cancer screening: Type of screening: Colonoscopy. Completed 09/08/17. Repeat every 10 years  Mammogram status: Completed 12/17/21. Repeat every year  Bone Density status: Ordered 06/11/22. Pt provided with contact info and advised to call to schedule appt.  Lung Cancer Screening: (Low Dose CT Chest recommended if Age 593-80years, 30 pack-year currently smoking OR have quit w/in 15years.) does qualify.   Lung Cancer Screening Referral: Patient deferred  Additional Screening:  Hepatitis C Screening: does qualify; Completed 02/23/17  Vision Screening: Recommended annual ophthalmology exams for early detection of glaucoma and other disorders of the eye. Is the patient up to date with their annual eye exam?  Yes  Who is the provider or what is the name of the office in which the patient attends annual eye exams? Dr GDelman CheadleIf pt is not established with a provider, would they like to be referred to a provider to establish care? No .   Dental Screening: Recommended annual  dental exams for proper oral hygiene  Community Resource Referral / Chronic Care Management:  CRR required this visit?  No   CCM required this visit?  No      Plan:     I have personally reviewed and noted the following in the patient's chart:   Medical and social history Use of alcohol, tobacco or illicit drugs  Current medications and supplements including opioid prescriptions. Patient is not currently taking opioid prescriptions. Functional ability and status Nutritional status Physical activity Advanced directives List of other physicians Hospitalizations, surgeries, and ER visits in previous 12 months Vitals Screenings to include cognitive, depression, and falls Referrals and appointments  In addition, I have reviewed and discussed with patient certain preventive protocols, quality metrics, and best practice recommendations. A written personalized care plan for preventive services as well as general preventive health recommendations were provided to patient.     BCriselda Peaches LPN   [id number]  Nurse Notes: None

## 2022-06-11 NOTE — Patient Instructions (Addendum)
Melissa Holmes , Thank you for taking time to come for your Medicare Wellness Visit. I appreciate your ongoing commitment to your health goals. Please review the following plan we discussed and let me know if I can assist you in the future.   These are the goals we discussed:  Goals       DIET - INCREASE WATER INTAKE      Travel to [place] (pt-stated)        This is a list of the screening recommended for you and due dates:  Health Maintenance  Topic Date Due   DEXA scan (bone density measurement)  Never done   COVID-19 Vaccine (3 - Moderna risk series) 06/27/2022*   Zoster (Shingles) Vaccine (1 of 2) 09/11/2022*   Flu Shot  12/07/2022*   Tetanus Vaccine  06/12/2023*   Mammogram  12/18/2023   Colon Cancer Screening  09/09/2027   Pneumonia Vaccine  Completed   Hepatitis C Screening: USPSTF Recommendation to screen - Ages 18-79 yo.  Completed   HPV Vaccine  Aged Out  *Topic was postponed. The date shown is not the original due date.    Advanced directives: Advance directive discussed with you today. Even though you declined this today, please call our office should you change your mind, and we can give you the proper paperwork for you to fill out.   Conditions/risks identified: None  Next appointment: Follow up in one year for your annual wellness visit     Preventive Care 65 Years and Older, Female Preventive care refers to lifestyle choices and visits with your health care provider that can promote health and wellness. What does preventive care include? A yearly physical exam. This is also called an annual well check. Dental exams once or twice a year. Routine eye exams. Ask your health care provider how often you should have your eyes checked. Personal lifestyle choices, including: Daily care of your teeth and gums. Regular physical activity. Eating a healthy diet. Avoiding tobacco and drug use. Limiting alcohol use. Practicing safe sex. Taking low-dose aspirin every  day. Taking vitamin and mineral supplements as recommended by your health care provider. What happens during an annual well check? The services and screenings done by your health care provider during your annual well check will depend on your age, overall health, lifestyle risk factors, and family history of disease. Counseling  Your health care provider may ask you questions about your: Alcohol use. Tobacco use. Drug use. Emotional well-being. Home and relationship well-being. Sexual activity. Eating habits. History of falls. Memory and ability to understand (cognition). Work and work Statistician. Reproductive health. Screening  You may have the following tests or measurements: Height, weight, and BMI. Blood pressure. Lipid and cholesterol levels. These may be checked every 5 years, or more frequently if you are over 81 years old. Skin check. Lung cancer screening. You may have this screening every year starting at age 50 if you have a 30-pack-year history of smoking and currently smoke or have quit within the past 15 years. Fecal occult blood test (FOBT) of the stool. You may have this test every year starting at age 36. Flexible sigmoidoscopy or colonoscopy. You may have a sigmoidoscopy every 5 years or a colonoscopy every 10 years starting at age 44. Hepatitis C blood test. Hepatitis B blood test. Sexually transmitted disease (STD) testing. Diabetes screening. This is done by checking your blood sugar (glucose) after you have not eaten for a while (fasting). You may have this done every  1-3 years. Bone density scan. This is done to screen for osteoporosis. You may have this done starting at age 60. Mammogram. This may be done every 1-2 years. Talk to your health care provider about how often you should have regular mammograms. Talk with your health care provider about your test results, treatment options, and if necessary, the need for more tests. Vaccines  Your health care  provider may recommend certain vaccines, such as: Influenza vaccine. This is recommended every year. Tetanus, diphtheria, and acellular pertussis (Tdap, Td) vaccine. You may need a Td booster every 10 years. Zoster vaccine. You may need this after age 79. Pneumococcal 13-valent conjugate (PCV13) vaccine. One dose is recommended after age 77. Pneumococcal polysaccharide (PPSV23) vaccine. One dose is recommended after age 18. Talk to your health care provider about which screenings and vaccines you need and how often you need them. This information is not intended to replace advice given to you by your health care provider. Make sure you discuss any questions you have with your health care provider. Document Released: 09/21/2015 Document Revised: 05/14/2016 Document Reviewed: 06/26/2015 Elsevier Interactive Patient Education  2017 [place] Prevention in the Home Falls can cause injuries. They can happen to people of all ages. There are many things you can do to make your home safe and to help prevent falls. What can I do on the outside of my home? Regularly fix the edges of walkways and driveways and fix any cracks. Remove anything that might make you trip as you walk through a door, such as a raised step or threshold. Trim any bushes or trees on the path to your home. Use bright outdoor lighting. Clear any walking paths of anything that might make someone trip, such as rocks or tools. Regularly check to see if handrails are loose or broken. Make sure that both sides of any steps have handrails. Any raised decks and porches should have guardrails on the edges. Have any leaves, snow, or ice cleared regularly. Use sand or salt on walking paths during winter. Clean up any spills in your garage right away. This includes oil or grease spills. What can I do in the bathroom? Use night lights. Install grab bars by the toilet and in the tub and shower. Do not use towel bars as grab  bars. Use non-skid mats or decals in the tub or shower. If you need to sit down in the shower, use a plastic, non-slip stool. Keep the floor dry. Clean up any water that spills on the floor as soon as it happens. Remove soap buildup in the tub or shower regularly. Attach bath mats securely with double-sided non-slip rug tape. Do not have throw rugs and other things on the floor that can make you trip. What can I do in the bedroom? Use night lights. Make sure that you have a light by your bed that is easy to reach. Do not use any sheets or blankets that are too big for your bed. They should not hang down onto the floor. Have a firm chair that has side arms. You can use this for support while you get dressed. Do not have throw rugs and other things on the floor that can make you trip. What can I do in the kitchen? Clean up any spills right away. Avoid walking on wet floors. Keep items that you use a lot in easy-to-reach places. If you need to reach something above you, use a strong step stool that has a  grab bar. Keep electrical cords out of the way. Do not use floor polish or wax that makes floors slippery. If you must use wax, use non-skid floor wax. Do not have throw rugs and other things on the floor that can make you trip. What can I do with my stairs? Do not leave any items on the stairs. Make sure that there are handrails on both sides of the stairs and use them. Fix handrails that are broken or loose. Make sure that handrails are as long as the stairways. Check any carpeting to make sure that it is firmly attached to the stairs. Fix any carpet that is loose or worn. Avoid having throw rugs at the top or bottom of the stairs. If you do have throw rugs, attach them to the floor with carpet tape. Make sure that you have a light switch at the top of the stairs and the bottom of the stairs. If you do not have them, ask someone to add them for you. What else can I do to help prevent  falls? Wear shoes that: Do not have high heels. Have rubber bottoms. Are comfortable and fit you well. Are closed at the toe. Do not wear sandals. If you use a stepladder: Make sure that it is fully opened. Do not climb a closed stepladder. Make sure that both sides of the stepladder are locked into place. Ask someone to hold it for you, if possible. Clearly mark and make sure that you can see: Any grab bars or handrails. First and last steps. Where the edge of each step is. Use tools that help you move around (mobility aids) if they are needed. These include: Canes. Walkers. Scooters. Crutches. Turn on the lights when you go into a dark area. Replace any light bulbs as soon as they burn out. Set up your furniture so you have a clear path. Avoid moving your furniture around. If any of your floors are uneven, fix them. If there are any pets around you, be aware of where they are. Review your medicines with your doctor. Some medicines can make you feel dizzy. This can increase your chance of falling. Ask your doctor what other things that you can do to help prevent falls. This information is not intended to replace advice given to you by your health care provider. Make sure you discuss any questions you have with your health care provider. Document Released: 06/21/2009 Document Revised: 01/31/2016 Document Reviewed: 09/29/2014 Elsevier Interactive Patient Education  2017 [employer].

## 2022-07-17 ENCOUNTER — Encounter: Payer: Self-pay | Admitting: Adult Health

## 2022-07-17 ENCOUNTER — Ambulatory Visit (INDEPENDENT_AMBULATORY_CARE_PROVIDER_SITE_OTHER): Payer: Medicare Other | Admitting: Adult Health

## 2022-07-17 VITALS — BP 110/70 | HR 70 | Temp 98.2°F | Ht 63.75 in | Wt 114.0 lb

## 2022-07-17 DIAGNOSIS — Z23 Encounter for immunization: Secondary | ICD-10-CM | POA: Diagnosis not present

## 2022-07-17 DIAGNOSIS — C569 Malignant neoplasm of unspecified ovary: Secondary | ICD-10-CM | POA: Diagnosis not present

## 2022-07-17 DIAGNOSIS — F419 Anxiety disorder, unspecified: Secondary | ICD-10-CM | POA: Diagnosis not present

## 2022-07-17 DIAGNOSIS — Z0000 Encounter for general adult medical examination without abnormal findings: Secondary | ICD-10-CM

## 2022-07-17 LAB — CBC WITH DIFFERENTIAL/PLATELET
Basophils Absolute: 0.1 10*3/uL (ref 0.0–0.1)
Basophils Relative: 0.9 % (ref 0.0–3.0)
Eosinophils Absolute: 0.2 10*3/uL (ref 0.0–0.7)
Eosinophils Relative: 2.4 % (ref 0.0–5.0)
HCT: 38.1 % (ref 36.0–46.0)
Hemoglobin: 12.6 g/dL (ref 12.0–15.0)
Lymphocytes Relative: 44.8 % (ref 12.0–46.0)
Lymphs Abs: 2.9 10*3/uL (ref 0.7–4.0)
MCHC: 33.2 g/dL (ref 30.0–36.0)
MCV: 98.9 fl (ref 78.0–100.0)
Monocytes Absolute: 0.5 10*3/uL (ref 0.1–1.0)
Monocytes Relative: 7.6 % (ref 3.0–12.0)
Neutro Abs: 2.8 10*3/uL (ref 1.4–7.7)
Neutrophils Relative %: 44.3 % (ref 43.0–77.0)
Platelets: 368 10*3/uL (ref 150.0–400.0)
RBC: 3.85 Mil/uL — ABNORMAL LOW (ref 3.87–5.11)
RDW: 13.6 % (ref 11.5–15.5)
WBC: 6.4 10*3/uL (ref 4.0–10.5)

## 2022-07-17 LAB — COMPREHENSIVE METABOLIC PANEL
ALT: 11 U/L (ref 0–35)
AST: 19 U/L (ref 0–37)
Albumin: 4.8 g/dL (ref 3.5–5.2)
Alkaline Phosphatase: 27 U/L — ABNORMAL LOW (ref 39–117)
BUN: 11 mg/dL (ref 6–23)
CO2: 29 mEq/L (ref 19–32)
Calcium: 9.7 mg/dL (ref 8.4–10.5)
Chloride: 97 mEq/L (ref 96–112)
Creatinine, Ser: 0.59 mg/dL (ref 0.40–1.20)
GFR: 93.42 mL/min (ref >60.00)
Glucose, Bld: 85 mg/dL (ref 70–99)
Potassium: 4.6 mEq/L (ref 3.5–5.1)
Sodium: 134 mEq/L — ABNORMAL LOW (ref 135–145)
Total Bilirubin: 0.5 mg/dL (ref 0.2–1.2)
Total Protein: 7.3 g/dL (ref 6.0–8.3)

## 2022-07-17 LAB — LIPID PANEL
Cholesterol: 196 mg/dL (ref 0–200)
HDL: 94.1 mg/dL (ref >39.00)
LDL Cholesterol: 92 mg/dL (ref 0–99)
NonHDL: 101.96
Total CHOL/HDL Ratio: 2
Triglycerides: 51 mg/dL (ref 0.0–149.0)
VLDL: 10.2 mg/dL (ref 0.0–40.0)

## 2022-07-17 LAB — TSH: TSH: 2.4 u[IU]/mL (ref 0.35–5.50)

## 2022-07-17 MED ORDER — ALPRAZOLAM 1 MG PO TABS: ORAL_TABLET | ORAL | 2 refills | Status: DC

## 2022-07-17 NOTE — Patient Instructions (Signed)
It was great seeing you today   We will follow up with you regarding your lab work   Please let me know if you need anything   

## 2022-07-17 NOTE — Progress Notes (Signed)
Subjective:    Patient ID: Melissa Holmes, female    DOB: [birth year], 66 y.o.   MRN: [id number]  HPI Patient presents for yearly preventative medicine examination. She is a 66 year old female who  has a past medical history of Allergy, Colon polyps, History of chicken pox, and Ovarian cancer ([place]).  History of ovarian cancer-diagnosed in 2019 at which time she had a hysterectomy.  She is followed by GYN oncology and is seen every 6 months until December 2024  Anxiety-takes Xanax 1 mg as needed. Feels well controlled.   All immunizations and health maintenance protocols were reviewed with the patient and needed orders were placed.  Appropriate screening laboratory values were ordered for the patient including screening of hyperlipidemia, renal function and hepatic function.  Medication reconciliation,  past medical history, social history, problem list and allergies were reviewed in detail with the patient  Goals were established with regard to weight loss, exercise, and  diet in compliance with medications. She stays active with exercise and is now working part time at [employer]. She eats a heart healthy diet    Wt Readings from Last 3 Encounters:  06/11/22 117 lb (53.1 kg)  02/11/22 117 lb (53.1 kg)  09/05/21 116 lb 12.8 oz (53 kg)   She is up to date on routine colon cancer screening   She has no acute complaints.   Review of Systems  Constitutional: Negative.   HENT: Negative.    Eyes: Negative.   Respiratory: Negative.    Cardiovascular: Negative.   Gastrointestinal: Negative.   Endocrine: Negative.   Genitourinary: Negative.   Musculoskeletal: Negative.   Skin: Negative.   Allergic/Immunologic: Negative.   Neurological: Negative.   Hematological: Negative.   Psychiatric/Behavioral: Negative.     Past Medical History:  Diagnosis Date   Allergy    Colon polyps    history of precancerous   History of chicken pox    Ovarian cancer ([place])    diagnosed 2  years ago-treated by a physician in [place] History   Marital status: Married    Spouse name: Not on file   Number of children: Not on file   Years of education: Not on file   Highest education level: Not on file  Occupational History   Not on file  Tobacco Use   Smoking status: Some Days   Smokeless tobacco: Never  Vaping Use   Vaping Use: Never used  Substance and Sexual Activity   Alcohol use: Yes    Comment: Occ wine   Drug use: Not Currently   Sexual activity: Not Currently  Other Topics Concern   Not on file  Social History Narrative   Not on file   Social Determinants of Health   Financial Resource Strain: Low Risk  (06/11/2022)   Overall Financial Resource Strain (CARDIA)    Difficulty of Paying Living Expenses: Not hard at all  Food Insecurity: No Food Insecurity (06/11/2022)   Hunger Vital Sign    Worried About Running Out of Food in the Last Year: Never true    Ran Out of Food in the Last Year: Never true  Transportation Needs: No Transportation Needs (06/11/2022)   PRAPARE - Hydrologist (Medical): No    Lack of Transportation (Non-Medical): No  Physical Activity: Sufficiently Active (06/11/2022)   Exercise Vital Sign    Days of Exercise per Week: 5 days    Minutes of  Exercise per Session: 30 min  Stress: No Stress Concern Present (06/11/2022)   [place]    Feeling of Stress : Not at all  Social Connections: Moderately Isolated (06/11/2022)   Social Connection and Isolation Panel [NHANES]    Frequency of Communication with Friends and Family: More than three times a week    Frequency of Social Gatherings with Friends and Family: More than three times a week    Attends Religious Services: Never    Marine scientist or Organizations: No    Attends Archivist Meetings: Never    Marital Status: Married  Arboriculturist Violence: Not At Risk (06/11/2022)   Humiliation, Afraid, Rape, and Kick questionnaire    Fear of Current or Ex-Partner: No    Emotionally Abused: No    Physically Abused: No    Sexually Abused: No    Past Surgical History:  Procedure Laterality Date   ABDOMINAL HYSTERECTOMY  2019   APPENDECTOMY  2019   BREAST BIOPSY Right 2019   benign per patient   TONSILLECTOMY  1965    Family History  Problem Relation Age of Onset   Arthritis Mother    Diabetes Mother    Hearing loss Mother    Hypertension Mother    Hyperlipidemia Mother    Depression Father    Heart disease Father    Heart attack Father    Multiple myeloma Brother    Early death Brother    Hearing loss Maternal Grandmother    Alcohol abuse Maternal Grandfather    Cancer Maternal Grandfather    Cancer Paternal Grandmother    Early death Paternal 45 / Stillbirths Paternal Grandmother    Diabetes Paternal Grandfather    Colon cancer Neg Hx    Breast cancer Neg Hx    Ovarian cancer Neg Hx    Endometrial cancer Neg Hx    Pancreatic cancer Neg Hx    Prostate cancer Neg Hx     No Known Allergies  Current Outpatient Medications on File Prior to Visit  Medication Sig Dispense Refill   ALPRAZolam (XANAX) 1 MG tablet TAKE 1 TABLET(1 MG) BY MOUTH DAILY AS NEEDED FOR ANXIETY 20 tablet 0   ASCORBIC ACID PO Take by mouth daily in the afternoon.     BINAXNOW COVID-19 AG HOME TEST KIT TEST AS DIRECTED TODAY (Patient not taking: Reported on 02/05/2022)     Cholecalciferol (D3 VITAMIN PO) Take by mouth daily in the afternoon.     Lactobacillus (PROBIOTIC ACIDOPHILUS PO) Take by mouth daily in the afternoon.     Lysine 1000 MG TABS Take by mouth.     No current facility-administered medications on file prior to visit.    There were no vitals taken for this visit.      Objective:   Physical Exam Vitals and nursing note reviewed.  Constitutional:      General: She is not in acute  distress.    Appearance: Normal appearance. She is well-developed. She is not ill-appearing.  HENT:     Head: Normocephalic and atraumatic.     Right Ear: Tympanic membrane, ear canal and external ear normal. There is no impacted cerumen.     Left Ear: Tympanic membrane, ear canal and external ear normal. There is no impacted cerumen.     Nose: Nose normal. No congestion or rhinorrhea.     Mouth/Throat:     Mouth: Mucous membranes  are moist.     Pharynx: Oropharynx is clear. No oropharyngeal exudate or posterior oropharyngeal erythema.  Eyes:     General:        Right eye: No discharge.        Left eye: No discharge.     Extraocular Movements: Extraocular movements intact.     Conjunctiva/sclera: Conjunctivae normal.     Pupils: Pupils are equal, round, and reactive to light.  Neck:     Thyroid: No thyromegaly.     Vascular: No carotid bruit.     Trachea: No tracheal deviation.  Cardiovascular:     Rate and Rhythm: Normal rate and regular rhythm.     Pulses: Normal pulses.     Heart sounds: Normal heart sounds. No murmur heard.    No friction rub. No gallop.  Pulmonary:     Effort: Pulmonary effort is normal. No respiratory distress.     Breath sounds: Normal breath sounds. No stridor. No wheezing, rhonchi or rales.  Chest:     Chest wall: No tenderness.  Abdominal:     General: Abdomen is flat. Bowel sounds are normal. There is no distension.     Palpations: Abdomen is soft. There is no mass.     Tenderness: There is no abdominal tenderness. There is no right CVA tenderness, left CVA tenderness, guarding or rebound.     Hernia: No hernia is present.  Musculoskeletal:        General: No swelling, tenderness, deformity or signs of injury. Normal range of motion.     Cervical back: Normal range of motion and neck supple.     Right lower leg: No edema.     Left lower leg: No edema.  Lymphadenopathy:     Cervical: No cervical adenopathy.  Skin:    General: Skin is warm and  dry.     Coloration: Skin is not jaundiced or pale.     Findings: No bruising, erythema, lesion or rash.  Neurological:     General: No focal deficit present.     Mental Status: She is alert and oriented to person, place, and time.     Cranial Nerves: No cranial nerve deficit.     Sensory: No sensory deficit.     Motor: No weakness.     Coordination: Coordination normal.     Gait: Gait normal.     Deep Tendon Reflexes: Reflexes normal.  Psychiatric:        Mood and Affect: Mood normal.        Behavior: Behavior normal.        Thought Content: Thought content normal.        Judgment: Judgment normal.       Assessment & Plan:  1. Routine general medical examination at a health care facility - Follow up in one year - Continue to stay active and eat healthy  - CBC with Differential/Platelet; Future - Comprehensive metabolic panel; Future - Lipid panel; Future - TSH; Future  2. Anxiety - Continue with Xanax as needed - CBC with Differential/Platelet; Future - Comprehensive metabolic panel; Future - Lipid panel; Future - TSH; Future - ALPRAZolam (XANAX) 1 MG tablet; TAKE 1 TABLET(1 MG) BY MOUTH DAILY AS NEEDED FOR ANXIETY  Dispense: 30 tablet; Refill: 2  3. Malignant neoplasm of ovary, unspecified laterality ([place]) - Follow up with Oncology as directed - CBC with Differential/Platelet; Future - Comprehensive metabolic panel; Future - Lipid panel; Future - TSH; Future  4. Need for immunization against  influenza  - Flu Vaccine QUAD High Dose(Fluad)  Dorothyann Peng, NP

## 2022-07-21 ENCOUNTER — Telehealth: Payer: Self-pay | Admitting: Adult Health

## 2022-07-21 NOTE — Telephone Encounter (Signed)
Pt called, returning CMA's call. CMA unavailable. Pt was informed that as per NP, all her labs are normal. Pt was very relieved and thankful.

## 2022-08-08 ENCOUNTER — Telehealth: Payer: Self-pay | Admitting: Surgery

## 2022-08-08 NOTE — Telephone Encounter (Signed)
Patient called to reschedule appointment from 12/5 to 12/13.

## 2022-08-12 ENCOUNTER — Other Ambulatory Visit: Payer: Self-pay | Admitting: Gynecologic Oncology

## 2022-08-12 ENCOUNTER — Ambulatory Visit: Payer: Medicare Other | Admitting: Gynecologic Oncology

## 2022-08-12 ENCOUNTER — Other Ambulatory Visit: Payer: Medicare Other

## 2022-08-12 DIAGNOSIS — C569 Malignant neoplasm of unspecified ovary: Secondary | ICD-10-CM

## 2022-08-13 ENCOUNTER — Inpatient Hospital Stay: Payer: Medicare Other | Attending: Gynecologic Oncology

## 2022-08-13 ENCOUNTER — Ambulatory Visit: Payer: Medicare Other | Admitting: Gynecologic Oncology

## 2022-08-13 DIAGNOSIS — Z90722 Acquired absence of ovaries, bilateral: Secondary | ICD-10-CM | POA: Insufficient documentation

## 2022-08-13 DIAGNOSIS — Z8543 Personal history of malignant neoplasm of ovary: Secondary | ICD-10-CM | POA: Insufficient documentation

## 2022-08-19 ENCOUNTER — Encounter: Payer: Self-pay | Admitting: Gynecologic Oncology

## 2022-08-20 ENCOUNTER — Other Ambulatory Visit: Payer: Self-pay

## 2022-08-20 ENCOUNTER — Inpatient Hospital Stay (HOSPITAL_BASED_OUTPATIENT_CLINIC_OR_DEPARTMENT_OTHER): Payer: Medicare Other | Admitting: Gynecologic Oncology

## 2022-08-20 ENCOUNTER — Inpatient Hospital Stay: Payer: Medicare Other

## 2022-08-20 VITALS — BP 116/54 | HR 78 | Temp 98.9°F | Resp 14 | Ht 64.0 in | Wt 116.0 lb

## 2022-08-20 DIAGNOSIS — C569 Malignant neoplasm of unspecified ovary: Secondary | ICD-10-CM

## 2022-08-20 DIAGNOSIS — Z8543 Personal history of malignant neoplasm of ovary: Secondary | ICD-10-CM

## 2022-08-20 DIAGNOSIS — Z90722 Acquired absence of ovaries, bilateral: Secondary | ICD-10-CM | POA: Diagnosis not present

## 2022-08-20 NOTE — Progress Notes (Signed)
Gynecologic Oncology Follow Up  Consult was requested by Sallee Provencal for the evaluation of Melissa Holmes 66 y.o. 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  Assessment/Plan: Melissa Holmes is a 66 y.o. 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  No evidence of recurrence/disease on today's exam. She will be contacted with the results of her CA 125 drawn today. Signs and symptoms of recurrence given in after visit summary. Follow up recommendations include office visits at 6 monthly intervals for surveillance exams and CA 125 assessments until December, 2024. She is advised to call for any needs, concerns, or new symptoms.   HPI: Melissa Holmes is a 66 year old female P0 initially seen in consultation at the request of Sallee Provencal NP for evaluation of a history of right ovarian cancer diagnosed in 2019.  Per Dr. Denman George: In September 2019 when living in [place], she developed right lower quadrant pain and GI distress. She was seen in the emergency department and was operated on with a laparoscopic appendectomy on Labor Day weekend.  Approximately 1 month following the surgery, she developed new right lower quadrant pain and indigestion and was seen again in the emergency department. At this time, she was diagnosed with a right ovarian cystic mass that measured approximately 8 cm and was complex cyst, cystic and solid.  She was taken to the operating room for laparoscopic BSO on July 01, 2018.  Pathology from this specimen revealed a FIGO grade 1 endometrioid adenocarcinoma of the right ovary with no surface involvement.  She met with Dr. Kristen Loader at [facility] oncology in [facility] for definitive surgical staging that was performed on 07/21/2018.  This included a robotic hysterectomy, pelvic and para-aortic  lymphadenectomy, omentectomy, peritoneal biopsies.  No residual carcinoma or metastatic carcinoma was identified in any of the specimens.  Complex atypical hyperplasia was noted within the endometrium with no invasion present.  This was felt to be unrelated to her ovarian carcinoma.  She received definitive staging as a Stage Ia grade 1 endometrioid adenocarcinoma of the right ovary.  She reported undergoing genetic testing at [facility] in [place] at the cancer center.  She reported this was negative for BRCA mutations.  No adjuvant therapy was recommended due to her early stage low-grade lesion.  She did not present for typical in person follow-up due to the emergence of the COVID-19 pandemic.  However she did intermittently have virtual visits with a general medical oncologist and had Ca 125 lab evaluations at 3 monthly intervals.  She relocated from [place] to [place] in the summer 2021 in order to be closer in proximity to her elderly mother.  At that time she established care with Sallee Provencal NP. Ca 125 was last drawn on 02/11/2022 and was normal at 9.9.  Interval Hx: She states she has been doing well since her last visit with no symptoms concerning for recurrence. She has had an eventful fall with her mother having cardiac arrest requiring stent placement, husband being in a car accident with his car being totaled. She continues to working for [employer] and stays busy with this. She continues to read and enjoys this very much.   She is tolerating her diet with no nausea or emesis.  She continues to report intermittent bloating symptoms which she relates to her dietary  choices.  No abdominal pain reported. Denies chest pain, dyspnea, new cough.  Bowels and bladder functioning without difficulty. No vaginal bleeding or discharge reported. Reporting intermittent leg and feet soreness after standing for long days at work.  For physical activity, she has started  stretching more and has been walking. No lower extremity edema reported.    Current Meds:  Outpatient Encounter Medications as of 08/20/2022  Medication Sig   ALPRAZolam (XANAX) 1 MG tablet TAKE 1 TABLET(1 MG) BY MOUTH DAILY AS NEEDED FOR ANXIETY   ASCORBIC ACID PO Take by mouth daily in the afternoon.   Cholecalciferol (D3 VITAMIN PO) Take by mouth daily in the afternoon.   Lactobacillus (PROBIOTIC ACIDOPHILUS PO) Take by mouth daily in the afternoon.   Lysine 1000 MG TABS Take by mouth.   No facility-administered encounter medications on file as of 08/20/2022.    Allergy: No Known Allergies  Social Hx:   Social History   Socioeconomic History   Marital status: Married    Spouse name: Not on file   Number of children: Not on file   Years of education: Not on file   Highest education level: Not on file  Occupational History   Not on file  Tobacco Use   Smoking status: Some Days   Smokeless tobacco: Never  Vaping Use   Vaping Use: Never used  Substance and Sexual Activity   Alcohol use: Yes    Comment: Occ wine   Drug use: Not Currently   Sexual activity: Not Currently  Other Topics Concern   Not on file  Social History Narrative   Not on file   Social Determinants of Health   Financial Resource Strain: Low Risk  (06/11/2022)   Overall Financial Resource Strain (CARDIA)    Difficulty of Paying Living Expenses: Not hard at all  Food Insecurity: No Food Insecurity (06/11/2022)   Hunger Vital Sign    Worried About Running Out of Food in the Last Year: Never true    Ran Out of Food in the Last Year: Never true  Transportation Needs: No Transportation Needs (06/11/2022)   PRAPARE - Hydrologist (Medical): No    Lack of Transportation (Non-Medical): No  Physical Activity: Sufficiently Active (06/11/2022)   Exercise Vital Sign    Days of Exercise per Week: 5 days    Minutes of Exercise per Session: 30 min  Stress: No Stress Concern Present  (06/11/2022)   [place]    Feeling of Stress : Not at all  Social Connections: Moderately Isolated (06/11/2022)   Social Connection and Isolation Panel [NHANES]    Frequency of Communication with Friends and Family: More than three times a week    Frequency of Social Gatherings with Friends and Family: More than three times a week    Attends Religious Services: Never    Marine scientist or Organizations: No    Attends Archivist Meetings: Never    Marital Status: Married  Human resources officer Violence: Not At Risk (06/11/2022)   Humiliation, Afraid, Rape, and Kick questionnaire    Fear of Current or Ex-Partner: No    Emotionally Abused: No    Physically Abused: No    Sexually Abused: No    Past Surgical Hx:  Past Surgical History:  Procedure Laterality Date   ABDOMINAL HYSTERECTOMY  2019   APPENDECTOMY  2019   BREAST BIOPSY Right 2019   benign per patient  TONSILLECTOMY  1965    Past Medical Hx:  Past Medical History:  Diagnosis Date   Allergy    Colon polyps    history of precancerous   History of chicken pox    Ovarian cancer ([place])    diagnosed 2 years ago-treated by a physician in [place]    Past Gynecological History:  See HPI, G0 No LMP recorded. Patient has had a hysterectomy.  Family Hx:  Family History  Problem Relation Age of Onset   Arthritis Mother    Diabetes Mother    Hearing loss Mother    Hypertension Mother    Hyperlipidemia Mother    Depression Father    Heart disease Father    Heart attack Father    Multiple myeloma Brother    Early death Brother    Hearing loss Maternal Grandmother    Alcohol abuse Maternal Grandfather    Cancer Maternal Grandfather    Cancer Paternal Grandmother    Early death Paternal 27 / Stillbirths Paternal Grandmother    Diabetes Paternal Grandfather    Colon cancer Neg Hx    Breast cancer Neg Hx    Ovarian  cancer Neg Hx    Endometrial cancer Neg Hx    Pancreatic cancer Neg Hx    Prostate cancer Neg Hx     Mammogram 12/17/21: negative Colonoscopy in 2019: negative  Review of Systems: Constitutional: Feels well. No unintentional weight loss. No early satiety.    ENT: Normal appearing ears and nares bilaterally Skin/Breast: No rash, sores, jaundice, itching, dryness Cardiovascular: No chest pain, shortness of breath, or edema  Pulmonary: No cough or wheeze.  Gastro Intestinal: No nausea, vomiting, or diarrhea. No bright red blood per rectum, no abdominal pain, change in bowel movement, or constipation.  Genito Urinary: No frequency, urgency, dysuria, no bleeding Musculo Skeletal: No new myalgia, arthralgia, joint swelling or pain  Neurologic: No weakness, numbness, change in gait   Vitals:  Blood pressure (!) 116/54, pulse 78, temperature 98.9 F (37.2 C), temperature source Oral, resp. rate 14, height _0  (1.626 m), weight 116 lb (52.6 kg), SpO2 98 %.  Physical Exam: General: Well developed, well nourished female in no acute distress. Alert and oriented x 3.  Neck: Supple without any enlargements.  Lymph node survey: No cervical, supraclavicular, or inguinal adenopathy.  Cardiovascular: Regular rate and rhythm. S1 and S2 normal.  Lungs: Clear to auscultation bilaterally. No wheezes/crackles/rhonchi noted.  Skin: No rashes or lesions present. Back: No CVA tenderness.  Abdomen: Abdomen soft, non-tender and non-obese. Active bowel sounds in all quadrants. No evidence of a fluid wave or abdominal masses. Incisions well healed with nodularity.  Genitourinary:    Vulva/vagina: Normal external female genitalia. No lesions.    Urethra: No lesions or masses    Vagina: Atrophic without any lesions. No palpable masses. No vaginal bleeding or drainage noted.  Rectal: Good tone, no masses, no cul de sac nodularity.  Extremities: No bilateral cyanosis, edema, or clubbing.    Dorothyann Gibbs, NP   08/20/2022, 4:16 PM

## 2022-08-20 NOTE — Patient Instructions (Signed)
Happy Holidays to you!   Your exam today is normal. We will contact you with the results of your CA 125 from today. Plan to follow up in six months with repeat CA 125 lab draw at that time or sooner if needed.    Symptoms to report to your health care team include vaginal bleeding, rectal bleeding, bloating, weight loss without effort, new and persistent pain, new and  persistent fatigue, new leg swelling, new masses (i.e., bumps in your neck or groin), new and persistent cough, new and persistent nausea and vomiting, change in bowel or bladder habits, and any other concerns.

## 2022-08-22 ENCOUNTER — Telehealth: Payer: Self-pay

## 2022-08-22 LAB — CA 125: Cancer Antigen (CA) 125: 10 U/mL (ref 0.0–38.1)

## 2022-08-22 NOTE — Telephone Encounter (Signed)
Pt aware of recent CA125 being stable and within normal range.

## 2022-09-16 ENCOUNTER — Encounter: Payer: Self-pay | Admitting: Adult Health

## 2022-09-16 ENCOUNTER — Telehealth: Payer: Medicare Other | Admitting: Adult Health

## 2022-09-16 ENCOUNTER — Telehealth (INDEPENDENT_AMBULATORY_CARE_PROVIDER_SITE_OTHER): Payer: Medicare Other | Admitting: Adult Health

## 2022-09-16 ENCOUNTER — Telehealth: Payer: Self-pay | Admitting: Adult Health

## 2022-09-16 VITALS — Ht 64.0 in | Wt 116.0 lb

## 2022-09-16 DIAGNOSIS — F430 Acute stress reaction: Secondary | ICD-10-CM | POA: Diagnosis not present

## 2022-09-16 NOTE — Telephone Encounter (Signed)
Pt has been scheduled.  °

## 2022-09-16 NOTE — Telephone Encounter (Signed)
Tried to call pt back no answer.

## 2022-09-16 NOTE — Telephone Encounter (Signed)
Pt called to say that she is shell-shocked, stressed, etc... with everything going on with her husband and she really needs a referral to see a mental health provider, ASAP.  Please advise.

## 2022-09-16 NOTE — Telephone Encounter (Signed)
Called pt to schedule a visit but she is on another call. Will try to call back shortly.

## 2022-09-16 NOTE — Progress Notes (Signed)
Virtual Visit via Video Note  I connected with Melissa Holmes  on 09/16/22 at  5:15 PM EST by a video enabled telemedicine application and verified that I am speaking with the correct person using two identifiers.  Location patient: home Location provider:work or home office Persons participating in the virtual visit: patient, provider  I discussed the limitations of evaluation and management by telemedicine and the availability of in person appointments. The patient expressed understanding and agreed to proceed.   HPI:  67 year old female who  has a past medical history of Allergy, Colon polyps, History of chicken pox, and Ovarian cancer ([place]).  She presents to the office today for referral to behavioral health. She reports having a lot of stress of anxiety from her husbands recent hospital admission where he fell and broke 4 ribs, had to have a chest tube and also went through alcohol withdrawal.   ROS: See pertinent positives and negatives per HPI.  Past Medical History:  Diagnosis Date   Allergy    Colon polyps    history of precancerous   History of chicken pox    Ovarian cancer ([place])    diagnosed 2 years ago-treated by a physician in [place]    Past Surgical History:  Procedure Laterality Date   ABDOMINAL HYSTERECTOMY  2019   APPENDECTOMY  2019   BREAST BIOPSY Right 2019   benign per patient   TONSILLECTOMY  1965    Family History  Problem Relation Age of Onset   Arthritis Mother    Diabetes Mother    Hearing loss Mother    Hypertension Mother    Hyperlipidemia Mother    Depression Father    Heart disease Father    Heart attack Father    Multiple myeloma Brother    Early death Brother    Hearing loss Maternal Grandmother    Alcohol abuse Maternal Grandfather    Cancer Maternal Grandfather    Cancer Paternal Grandmother    Early death Paternal 76 / Stillbirths Paternal Grandmother    Diabetes Paternal Grandfather    Colon  cancer Neg Hx    Breast cancer Neg Hx    Ovarian cancer Neg Hx    Endometrial cancer Neg Hx    Pancreatic cancer Neg Hx    Prostate cancer Neg Hx        Current Outpatient Medications:    ALPRAZolam (XANAX) 1 MG tablet, TAKE 1 TABLET(1 MG) BY MOUTH DAILY AS NEEDED FOR ANXIETY, Disp: 30 tablet, Rfl: 2   ASCORBIC ACID PO, Take by mouth daily in the afternoon., Disp: , Rfl:    Cholecalciferol (D3 VITAMIN PO), Take by mouth daily in the afternoon., Disp: , Rfl:    Lactobacillus (PROBIOTIC ACIDOPHILUS PO), Take by mouth daily in the afternoon., Disp: , Rfl:    Lysine 1000 MG TABS, Take by mouth., Disp: , Rfl:   EXAM:  VITALS per patient if applicable:  GENERAL: alert, oriented, appears well and in no acute distress  HEENT: atraumatic, conjunttiva clear, no obvious abnormalities on inspection of external nose and ears  NECK: normal movements of the head and neck  LUNGS: on inspection no signs of respiratory distress, breathing rate appears normal, no obvious gross SOB, gasping or wheezing  CV: no obvious cyanosis  MS: moves all visible extremities without noticeable abnormality  PSYCH/NEURO: pleasant and cooperative, no obvious depression or anxiety, speech and thought processing grossly intact  ASSESSMENT AND PLAN:  Discussed the following assessment  and plan:  1. Stress reaction - Ambulatory referral to Psychiatry      I discussed the assessment and treatment plan with the patient. The patient was provided an opportunity to ask questions and all were answered. The patient agreed with the plan and demonstrated an understanding of the instructions.   The patient was advised to call back or seek an in-person evaluation if the symptoms worsen or if the condition fails to improve as anticipated.   Dorothyann Peng, NP   Time spent with patient today was 33 minutes which consisted of chart review, discussing diagnosis, work up, treatment answering, listening,  questions and  documentation.

## 2022-10-30 ENCOUNTER — Ambulatory Visit (HOSPITAL_COMMUNITY): Payer: Medicare Other | Admitting: Clinical

## 2022-10-30 DIAGNOSIS — F4322 Adjustment disorder with anxiety: Secondary | ICD-10-CM | POA: Diagnosis not present

## 2022-10-31 ENCOUNTER — Encounter (HOSPITAL_COMMUNITY): Payer: Self-pay

## 2022-10-31 ENCOUNTER — Encounter (HOSPITAL_COMMUNITY): Payer: Self-pay | Admitting: Clinical

## 2022-10-31 NOTE — Progress Notes (Signed)
Comprehensive Clinical Assessment (CCA) Note  10/31/2022 Melissa Holmes [id number]  Chief Complaint:  Chief Complaint  Patient presents with   Establish Care   Anxiety   Visit Diagnosis:  Name Primary?   Adjustment disorder with anxious mood (F43.22) Yes      CCA Biopsychosocial Intake/Chief Complaint:  Patient is a 67yo female who presents for therapy due to anxiety that has worsened recently since her partner of many years fell and broke some ribs, after which he went into alcohol withdrawal that necessitated him being in restraints for 5 days.  She had never previously seen anything like that.  Before this happened, they drank wine together every night although it was never a problem for her.  She decided while he was in the hospital to rid of the home of all alcohol, which she did, and she has abstained from consuming any alcohol in the meantime at the same time that he was doing this.  Recently, however, he has started drinking again and he will show her exactly how much he is going to drink as though to prove to her there is nothing wrong with it.  She would like to reduce the anxiety of life by learning how to deal with her partner in a nonconfrontational manner and respond to his provocations in a more helpful manner.  She has done a great deal of research during this process and thinks he was probably already an alcoholic when they met.  She joined a variety of Facebook groups on alcoholism.  Her partner is consuming a lot of ice cream currently and she states he can afford to gain weight so that is fine and does not worry her.  Patient is originally from [place], [place] but she and her partner lived in [place] in a small community for 18 years prior to moving to [place] 2 yars ago to join her parents and to have a lower cost of living.  She has a history of cancer and five years ago had 3 surgeries in 10 weeks.  Her PHQ-9 score is 4 and her GAD-7 score is 13.  Current  Symptoms/Problems: worrying, anxiety, trouble relaxing, dreading something happening, irritability  Patient Reported Schizophrenia/Schizoaffective Diagnosis in Past: No  Strengths: Doing research so that she knows how to respond better, articulate, open, educated, feels she is compassionate and honest  Preferences: individual therapy  Abilities: Able to engage in therapy, can do home practice assignments  Type of Services Patient Feels are Needed: therapy  Initial Clinical Notes/Concerns: Patient did drink every day prior to this event during which she decided to stop because of her partner's problem.  She also uses a gummy of THC nightly for sleep.  She was unfocused during the assessment, may need more redirection.  Mental Health Symptoms Depression:   Fatigue; Increase/decrease in appetite; Difficulty Concentrating   Duration of Depressive symptoms:  Greater than two weeks   Mania:   None   Anxiety:    Difficulty concentrating; Irritability; Fatigue; Sleep; Tension; Worrying   Psychosis:  No data recorded  Duration of Psychotic symptoms: No data recorded  Trauma:   None   Obsessions:   None   Compulsions:   None   Inattention:   None   Hyperactivity/Impulsivity:   None   Oppositional/Defiant Behaviors:  No data recorded  Emotional Irregularity:   None   Other Mood/Personality Symptoms:  No data recorded   Mental Status Exam Appearance and self-care  Stature:   Small   Weight:  Thin   Clothing:   Neat/clean   Grooming:   Well-groomed   Cosmetic use:   Age appropriate   Posture/gait:   Normal   Motor activity:   Not Remarkable   Sensorium  Attention:   Normal   Concentration:   Focuses on irrelevancies; Anxiety interferes   Orientation:   X5   Recall/memory:   Normal   Affect and Mood  Affect:   Anxious   Mood:   Anxious   Relating  Eye contact:   Normal   Facial expression:   Angry   Attitude toward examiner:    Cooperative   Thought and Language  Speech flow:  Normal   Thought content:   Appropriate to Mood and Circumstances   Preoccupation:   None   Hallucinations:   None   Organization:  No data recorded  [employer] of Knowledge:   Average   Intelligence:   Average   Abstraction:   Functional   Judgement:   Good   Reality Testing:   Realistic   Insight:   Good   Decision Making:   Normal   Social Functioning  Social Maturity:   Responsible   Social Judgement:   Normal   Stress  Stressors:   Family conflict; Relationship   Coping Ability:   Normal   Skill Deficits:   Interpersonal   Supports:   Friends/Service system; Family (best friend in [place], best friend in [place], mother at times, aunt)    Religion: Religion/Spirituality Are You A Religious Person?: Yes What is Your Religious Affiliation?:  (identifies as "spiritual" and was raised Catholic) How Might This Affect Treatment?: None  Leisure/Recreation: Leisure / Recreation Do You Have Hobbies?: Yes Leisure and Hobbies: traveling  Exercise/Diet: Exercise/Diet Do You Exercise?: No Have You Gained or Lost A Significant Amount of Weight in the Past Six Months?: Yes-Lost Number of Pounds Lost?: 6 Do You Follow a Special Diet?: No Do You Have Any Trouble Sleeping?: No  CCA Employment/Education Employment/Work Situation: Employment / Work Situation Employment Situation: Employed Where is Patient Currently Employed?: Furniture conservator/restorer as a Scientist, 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, Stage manager part-time How Long has Patient Been Employed?: 2 years Are You Satisfied With Your Job?: Yes Do You Work More Than One Job?: Yes Work Stressors: may work more hours than she wants to sometimes, since she is "semi-retired" Patient's Job has Been Impacted by Current Illness: No What is the Longest Time Patient has Held a Job?: 25 years Where was the Patient Employed at that Time?: [place] Patient ever Been in the [employer]?: No  Education: Education Is Patient Currently Attending School?: No Last Grade Completed: 16 Did Teacher, adult education From [employer]?: Yes Did Physicist, medical?: Yes What Type of College Degree Do you Have?: [place] Did [place]?: No What Was Your Major?: Marketing Did You Have Any Special Interests In School?: languages Did You Have Any Difficulty At [employer]?: No   CCA Family/Childhood History Family and Relationship History: Family history Marital status: Long term relationship Long term relationship, how long?: 31 years, not married What types of issues is patient dealing with in the relationship?: He may have been an alcoholic when they met in their 53s.  He has been drinking, just feel and broke ribs, was hospitalized and went through very difficult withdrawals.  She took the alcohol out of the home and he has brought it back in.  He is passive while she makes  decisions about housing, packs them for moves, and such. Additional relationship information: She has never been married Does patient have children?: No (She reports that even as a child she never wanted to have children.)  Childhood History:  Childhood History By whom was/is the patient raised?: Both parents, Mother Additional childhood history information: [place] family, living in [place] Description of patient's relationship with caregiver when they were a child: Both parents had a good relationship.  They worked a lot. Patient's description of current relationship with people who raised him/her: Father died 6 years ago.  Mother is still living and lives locally, still uses Catholic guilt on patient. How were you disciplined when you got in trouble as a child/adolescent?: shamed Does patient have siblings?: Yes Number of Siblings: 3 Description of patient's current relationship with siblings: had 3 brothers, one of whom died in infancy and one who died  3 years ago. Did patient suffer any verbal/emotional/physical/sexual abuse as a child?: No Did patient suffer from severe childhood neglect?: No Has patient ever been sexually abused/assaulted/raped as an adolescent or adult?: No Was the patient ever a victim of a crime or a disaster?: No Witnessed domestic violence?: No Has patient been affected by domestic violence as an adult?: No  CCA Substance Use Alcohol/Drug Use: Alcohol / Drug Use Pain Medications: Denies Prescriptions: Denies Over the Counter: PRN History of alcohol / drug use?: Yes Longest period of sobriety (when/how long): Not asked Withdrawal Symptoms: None Substance #1 Name of Substance 1: Alcohol 1 - Age of First Use: [age]yo 1 - Amount (size/oz): 1/2 glass wine 1 - Frequency: daily 1 - Duration: years 1 - Last Use / Amount: a few days 1 - Method of Aquiring: store 1- Route of Use: oral Substance #2 Name of Substance 2: Marijuana 2 - Amount (size/oz): 1 gummy 2 - Frequency: nightly before bed 2 - Last Use / Amount: last night 2 - Method of Aquiring: purchase 2 - Route of Substance Use: ingest    ASAM's:  Six Dimensions of Multidimensional Assessment  Dimension 1:  Acute Intoxication and/or Withdrawal Potential:  None    Dimension 2:  Biomedical Conditions and Complications:  None      Dimension 3:  Emotional, Behavioral, or Cognitive Conditions and Complications:   None    Dimension 4:  Readiness to Change:   None    Dimension 5:  Relapse, Continued use, or Continued Problem Potential:  None     Dimension 6:  Recovery/Living Environment:   None    ASAM Severity Score:  0  ASAM Recommended Level of Treatment: ASAM Recommended Level of Treatment: Level I Outpatient Treatment   Substance use Disorder (SUD)  Denies symptoms  Recommendations for Services/Supports/Treatments: Recommendations for Services/Supports/Treatments Recommendations For Services/Supports/Treatments: Individual Therapy  DSM5  Diagnoses: Patient Active Problem List   Diagnosis Date Noted   Ovarian cancer [facility])    Colon polyps    Patient Centered Plan: Patient is on the following Treatment Plan(s):  Anxiety  Problem: Anxiety   LTG: Melissa "Juliann Pulse" will score less than 5 on the Generalized Anxiety Disorder 7 Scale (GAD-7)    STG: Melissa "Juliann Pulse" will participate in at least 80% of scheduled individual psychotherapy sessions    Intervention: Work with Georga Kaufmann "Juliann Pulse" to identify 3 personal goals for managing their anxiety to work on during current treatment.    Intervention: Perform motivational interviewing regarding use of tools   Intervention: Perform motivational interviewing regarding Stages of Change with regard to her substance  use   Intervention: Continue cognitive-behavioral therapy to address issues with significant other.     Referrals to Alternative Service(s): Referred to Alternative Service(s):  Not applicable Place:   Date:   Time:      Collaboration of Care: Primary Care Provider AEB - doctor has access to therapy notes  Patient/Guardian was advised Release of Information must be obtained prior to any record release in order to collaborate their care with an outside provider. Patient/Guardian was advised if they have not already done so to contact the registration department to sign all necessary forms in order for [place] to release information regarding their care.   Consent: Patient/Guardian gives verbal consent for treatment and assignment of benefits for services provided during this visit. Patient/Guardian expressed understanding and agreed to proceed.   Recommendations:  Return to therapy in 2 weeks, engage in self care behaviors  Maretta Los, LCSW

## 2022-11-03 ENCOUNTER — Other Ambulatory Visit: Payer: Self-pay | Admitting: Adult Health

## 2022-11-03 DIAGNOSIS — F419 Anxiety disorder, unspecified: Secondary | ICD-10-CM

## 2022-11-03 NOTE — Telephone Encounter (Signed)
Patient calling to check and make sure pharmacy sent this request

## 2022-11-04 NOTE — Telephone Encounter (Signed)
Okay for refill?  

## 2022-11-10 ENCOUNTER — Encounter (HOSPITAL_COMMUNITY): Payer: Self-pay | Admitting: Clinical

## 2022-11-10 ENCOUNTER — Ambulatory Visit (INDEPENDENT_AMBULATORY_CARE_PROVIDER_SITE_OTHER): Payer: Medicare Other | Admitting: Clinical

## 2022-11-10 DIAGNOSIS — F4322 Adjustment disorder with anxiety: Secondary | ICD-10-CM

## 2022-11-10 NOTE — Progress Notes (Signed)
THERAPIST PROGRESS NOTE  Session Time: 9:04-10:04am  Session #2  Participation Level: Active  Behavioral Response: Casual and Well GroomedAlertNegative and Anxious  Type of Therapy: Individual Therapy  Treatment Goals addressed:  LTG: Melissa "Juliann Pulse" will score less than 5 on the Generalized Anxiety Disorder 7 Scale (GAD-7)    STG: Melissa "Juliann Pulse" will participate in at least 80% of scheduled individual psychotherapy sessions      ProgressTowards Goals: Progressing  Interventions: CBT and Supportive  Summary: Melissa Holmes is a 67 y.o. female who presents with increased anxiety since her partner fell and broke ribs, then went through a particularly bad detox from alcohol.  He had started back to drinking and has kept doing so, while she has remained without alcohol after getting rid of all the alcohol in the house when she realized just how bad his issue was.  She stated she had been nervous for 3 days about this therapy session because she has been thinking and realizing how bad things in her life really are.  She stated that since childhood it has been ingrained in her to not show the world how she truly feels inside.  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  She stated she has continued to read various stories on [employer] about living with an alcoholic and has identified with many, in [place].  She stated she wants to know "how to get rid of the embarrassment, the pride, the stubbornness, the guilt, and how to not feel like I'm abandoning someone who has always been abandoned by others so this will send him over the brink."  She stated she is specifically talking about moving out of their house that they own together and dissolving the relationship.  Even though she set up her own finances about 5 years ago when "he did the same thing," she feels that he is in  an advantageous position now because of all the help she provided him; therefore, she wants to make him sign an irrevocable will leaving everything to her, even if he should get into another relationship.  She spent a good deal of time describing how she thought that he had never been married like her when they met when she was 35yo.  But then he turned out to be married and not divorced, so she helped to track down the ex and secure what they thought was an uncontested divorce that then became contested.  She called herself various names throughout the session in a negative manner (for instance, "I'm a selfish cheap bitch"), but when this was pointed out, she immediately stated why she thinks the words are positive ("selfish is good because you can't be selfless, that's not good").  Another phrase she used on herself was "intelligent dumb-dumb."    She shared a few things that have happened with mother, even to the point of her yelling at mother last night.  Because time was out, CSW brought the session to a close but she said she had really wanted homework.  CSW gave her a copy of the Thinking Errors (cognitive distortions) and shared about using "I" statements.  She was challenged to try to use those until next session.  Suicidal/Homicidal: No  Therapist Response: Patient began talking immediately upon entering the office and seemed to really need to just share all her feelings and thoughts about  the situation she is finding herself in after 30 years with her partner.  Therapist did not try to redirect her until she invited comment.  She asked for homework, was given the list of Thinking Errors and was briefly educated on CBT and the effect of thoughts on feelings.  She stated she just really needed to get all the information out and next time she wants to give therapist time to talk and provide suggestions.  She stated she felt better just talking because these are all things she has not and will not  share with others, especially the people closest to her who are also her biggest problems, her mother and her partner. Because she reiterated again how much she needed some suggestions, CSW briefly educated her on the usefulness of "I" statements and encouraged her to try those and see what a difference they might make.  Recommendations:  Return to therapy in 2 weeks, engage in self care behaviors, look at "Thinking Traps" handout in preparation to discuss cognitive distortions, implement 1 new coping skill as taught in session ("I" statements), and return to next session prepared to talk about experience with that new coping method.   Plan: Return again in 2 weeks.  Diagnosis: Adjustment disorder with anxious mood F43.22  Collaboration of Care: Other - none indicated at this time  Patient/Guardian was advised Release of Information must be obtained prior to any record release in order to collaborate their care with an outside provider. Patient/Guardian was advised if they have not already done so to contact the registration department to sign all necessary forms in order for [place] to release information regarding their care.   Consent: Patient/Guardian gives verbal consent for treatment and assignment of benefits for services provided during this visit. Patient/Guardian expressed understanding and agreed to proceed.   Maretta Los, LCSW 11/10/2022

## 2022-11-14 ENCOUNTER — Other Ambulatory Visit: Payer: Self-pay | Admitting: Adult Health

## 2022-11-14 DIAGNOSIS — Z1231 Encounter for screening mammogram for malignant neoplasm of breast: Secondary | ICD-10-CM

## 2022-11-26 ENCOUNTER — Encounter (HOSPITAL_COMMUNITY): Payer: Self-pay | Admitting: Clinical

## 2022-11-26 ENCOUNTER — Ambulatory Visit (INDEPENDENT_AMBULATORY_CARE_PROVIDER_SITE_OTHER): Payer: Medicare Other | Admitting: Clinical

## 2022-11-26 DIAGNOSIS — F4322 Adjustment disorder with anxiety: Secondary | ICD-10-CM | POA: Diagnosis not present

## 2022-11-26 NOTE — Progress Notes (Signed)
THERAPIST PROGRESS NOTE  Session Time: 11:10am-12:10pm  Session #3  Participation Level: Active  Behavioral Response: Casual and Well Groomed Alert Euthymic and Hopeful  Type of Therapy: Individual Therapy  Treatment Goals addressed:  LTG: Melissa "Juliann Pulse" will score less than 5 on the Generalized Anxiety Disorder 7 Scale (GAD-7)    STG: Melissa "Juliann Pulse" will participate in at least 80% of scheduled individual psychotherapy sessions      ProgressTowards Goals: Progressing  Interventions: CBT and Supportive  Summary: Melissa Holmes is a 67 y.o. female who presents with increased anxiety since her partner fell and broke ribs, then went back to drinking after a particularly bad detox from alcohol. She stated she had been worried about setting up sessions with CSW and having to wait too long between sessions, so she has set some additional times with front desk.  After her last session in which CSW pointed out some of her cognitive distortions as she was talking negatively about herself, when she left the appointment she realized that in fact she had been very hard on herself.  She started to recognize the anger she has kept inside that she normally just stuffs down and is aware that it will be healthier for her to let it out and process it.  She also talked about treating herself to a few clothing items which she could purchase using bonus points achieved simply by paying for necessities with her credit cards.  She was able to acknowledge how wise she has been with money.  She talked at length about her mother's 95th birthday party which had been causing a lot of anxiety.  It ended up quite well and she did a good job with setting personal boundaries for herself to not become overly involved with husband's drinking/not drinking decision.  She stated she has drank small amounts of wine a couple of times but she continues to refuse to put it in their home, and she has been very clear with  her husband that she was scared by the last hospital episode, does not want to ever go through that again.  Because she is talking in more "I" centered language, it seems he is more able to hear her and he has come back to her assuring her of various ways in which he is committed to their relationship and to taking care of her should she ever need it.  Her weight loss has tapered off and she is feeling quite good about herself right now.  She asked for feedback which was given by CSW.  She was asked to go back and continue to use the "I" statements and examine the Thinking Traps handout to see how they might apply to her.   She was provided with positive support and encouragement for thinking about how she has been treating herself and making a decision to do something different about that.  She talked about going out and taking care of herself intentionally and how she needs to continue doing so with or without him, getting new flooring because the house needs whether they live there together or not, and such.  She reached out in a social situation with an [place] acquaintance and made a new friend.  She displayed a commitment to herself in recent weeks to take care of herself and not sacrifice herself to her relationship since her partner may not be there for her due to his addiction and as much as she regrets this, she realizes it is necessary.  Suicidal/Homicidal: No  Therapist Response: Patient once again began talking as soon as she entered the room and just led the way to what she wanted to talk about.  She was happy without being giddy and was pleased with herself without being manic.  She was honest and forthright about wanting feedback.  She will need more homework at next session.  She responded very positively to feedback that she had done well in setting boundaries with her partner when he insisted on drinking wine at dinner in front of her (she left the table until he invited her to come back if  he put the wine away).  She also responded well to encouragement to keep treating herself well.  Recommendations:  Return to therapy in 1 week, engage in self care behaviors, look at "Thinking Traps" handout some more, continue to work on "I" statements  Plan: Return again in 2 weeks.  Diagnosis: Adjustment disorder with anxious mood F43.22  Collaboration of Care: Other - none indicated at this time  Patient/Guardian was advised Release of Information must be obtained prior to any record release in order to collaborate their care with an outside provider. Patient/Guardian was advised if they have not already done so to contact the registration department to sign all necessary forms in order for [place] to release information regarding their care.   Consent: Patient/Guardian gives verbal consent for treatment and assignment of benefits for services provided during this visit. Patient/Guardian expressed understanding and agreed to proceed.   Maretta Los, LCSW 11/26/2022

## 2022-12-05 ENCOUNTER — Ambulatory Visit (INDEPENDENT_AMBULATORY_CARE_PROVIDER_SITE_OTHER): Payer: Medicare Other | Admitting: Clinical

## 2022-12-05 ENCOUNTER — Encounter (HOSPITAL_COMMUNITY): Payer: Self-pay | Admitting: Clinical

## 2022-12-05 DIAGNOSIS — F4322 Adjustment disorder with anxiety: Secondary | ICD-10-CM

## 2022-12-05 DIAGNOSIS — Z630 Problems in relationship with spouse or partner: Secondary | ICD-10-CM | POA: Diagnosis not present

## 2022-12-05 NOTE — Progress Notes (Signed)
THERAPIST PROGRESS NOTE  Session Time: 11:00am-12:00pm  Session #4  Participation Level: Active  Behavioral Response: Casual and Well Groomed Alert Euthymic and Irritable  Type of Therapy: Individual Therapy  Treatment Goals addressed:  LTG: Melissa "Juliann Pulse" will score less than 5 on the Generalized Anxiety Disorder 7 Scale (GAD-7)    STG: Melissa "Juliann Pulse" will participate in at least 80% of scheduled individual psychotherapy sessions      ProgressTowards Goals: Progressing  Interventions: CBT, Assertiveness Training, and Supportive  Summary: Melissa Holmes is a 67 y.o. female who presents with increased anxiety since her partner fell and broke ribs, then went back to drinking after a particularly bad detox from alcohol. She once again came into the session with a specific agenda that took not only the entire time allotted, but past it.  She told of specific incidents that happened with her mother and with her partner, and in each of these CSW felt it necessary to point out that from the other person's perspective, they could have felt she was aggressive.  Mother asked her if her therapist sees the aggression she has, and she felt this was laughable.  CSW gently tried to show her how assertiveness which is forceful can be perceived as aggression, especially to someone frail like her elderly mother.  CSW made suggestions on continuing to work on using "I" statements, not using "Why" questions, not listening to respond, and paying attention to Thinking Traps to ensure she is not trying to mind read.  She waved the handout about cognitive distortions around, saying she has read it many times this week.  CSW asked her what she has noticed and she shared that she often thinks the worst.  Her examples were more about situations such as somebody dying as opposed to interactions with others.  Patient had multiple complaints about both mother and partner, is working on improved boundaries.  She  feels she has been acquiescent a very long time with both of them, so her firmness is understandable. CSW encouraged her to continue this work.    She also explained she is considering making a suggestion to her partner that they get a dog, since they have been 3 years without the dog they had previously and loved.  They may dog-sit in the meantime to see what the experience is like.  CSW encouraged her to get her partner involved in this decision, shared some of the scientific data about the impact of petting a dog on a person's mood.  Suicidal/Homicidal: No  Therapist Response: Patient is making progress in working to establish what she considers to be better boundaries.  She will continue to work on doing this in a way that does not cross the boundaries of others, as she likely did when she insisted on texting her mother's friend even when mother insisted that she not do so.  CSW provided mood monitoring and treatment progress review in the context of this episode of treatment.   CSW gave patient the opportunity to explore thoughts and feelings associated with current life situations and past/present external stressors as desired.   CSW encouraged patient's expression of feelings and validated patient's thoughts, using empathy, active listening, open body language, and unconditional positive regard.  Patient demonstrated an orientation to time, place, person and situation.      Recommendations:  Return to therapy in 1 week, engage in self care behaviors, look at "Thinking Traps" handout some more, continue to work on "I" statements  Plan: Return  again in 2 weeks.  Diagnosis:  Relationship problem between partners  Adjustment disorder with anxious mood   Collaboration of Care: Other - none indicated at this time  Patient/Guardian was advised Release of Information must be obtained prior to any record release in order to collaborate their care with an outside provider. Patient/Guardian was  advised if they have not already done so to contact the registration department to sign all necessary forms in order for [place] to release information regarding their care.   Consent: Patient/Guardian gives verbal consent for treatment and assignment of benefits for services provided during this visit. Patient/Guardian expressed understanding and agreed to proceed.   Maretta Los, LCSW 12/05/2022

## 2022-12-10 ENCOUNTER — Ambulatory Visit
Admission: RE | Admit: 2022-12-10 | Discharge: 2022-12-10 | Disposition: A | Discharge status: Home / Self Care | Payer: Medicare Other | Source: Ambulatory Visit | Attending: Adult Health | Admitting: Adult Health

## 2022-12-10 DIAGNOSIS — Z13820 Encounter for screening for osteoporosis: Secondary | ICD-10-CM

## 2022-12-10 DIAGNOSIS — M85852 Other specified disorders of bone density and structure, left thigh: Secondary | ICD-10-CM | POA: Diagnosis not present

## 2022-12-10 DIAGNOSIS — Z780 Asymptomatic menopausal state: Secondary | ICD-10-CM | POA: Diagnosis not present

## 2022-12-17 ENCOUNTER — Encounter (HOSPITAL_COMMUNITY): Payer: Self-pay | Admitting: Clinical

## 2022-12-17 ENCOUNTER — Ambulatory Visit (INDEPENDENT_AMBULATORY_CARE_PROVIDER_SITE_OTHER): Payer: Medicare Other | Admitting: Clinical

## 2022-12-17 DIAGNOSIS — F419 Anxiety disorder, unspecified: Secondary | ICD-10-CM

## 2022-12-17 DIAGNOSIS — F32A Depression, unspecified: Secondary | ICD-10-CM | POA: Diagnosis not present

## 2022-12-17 NOTE — Progress Notes (Signed)
THERAPIST PROGRESS NOTE  Session Time: 11:00am-12:00pm  Session #4  Participation Level: Active  Behavioral Response: Casual and Well Groomed Alert Euthymic and Irritable  Type of Therapy: Individual Therapy  Treatment Goals addressed:  LTG: Melissa "[name]" will score less than 5 on the Generalized Anxiety Disorder 7 Scale (GAD-7)    STG: Melissa "[name]" will participate in at least 80% of scheduled individual psychotherapy sessions      ProgressTowards Goals: Not Progressing  Interventions: CBT, Assertiveness Training, and Supportive  Summary: Melissa Holmes is a 67 y.o. female who presents with increased anxiety since her partner fell and broke ribs, then went back to drinking after a particularly bad detox from alcohol. She stated immediately that things have not been going well and she almost called to cancel the appointment because she was not sure if it is helping to talk to CSW, stating "If others are not going to change, why bother?"; she also shared that she had really needed an appointment last week when some of her stress had gotten overwhelming.  When she reads the [employer], it frustrates her that people say "you just have to do it" because she does not feel it is that simple.  CSW provided much support that in fact it is not easy, suggesting that perhaps people are saying that the hardest part is getting started.  Her long-term partner is no longer drinking just a little, but has "progressed to getting hammered."  They just experienced the anniversary of their beloved dog's death and it affected both of them.  She has been staying in her bedroom reading a lot in order to avoid spending time with him.  She takes on all household projects such as plumbing because he does not know how and is not motivated to do it.  If she does not do these things, they will not get done and could affect her home, so she just takes care of it.  She stated that her partner is so  pleasant to other people that everyone at work and in their lives thinks he is wonderful.  To her, however, he is so negative she can barely watch TV with him.  She is considering changing her work schedule so that she is out of the house when he is off from work.  He has been making statements about only having 2 years to live and how he hopes that she will not put him out on the street in the last 2 years of his life.  The relationship and his current negativity and anger are so intense that she has been crying herself to sleep at night.  She is proceeding with plans of things she wants to do, such as attending a barbeque, going on a trip in June, another trip in September, putting down hardwood floor in the house, and such.  He finds fault with it all.  He questions her about seeing a therapist, wanting to know if she complains about him.  When asked why she does stay in what she acknowledges is a toxic relationship, she states that she does not want to leave her house and she does not want to live with her mother who is another source of toxicity  Suicidal/Homicidal: No  Therapist Response: Patient is not making progress in making any changes to the way she thinks or the way she reacts to partner.  She is unable to acknowledge any thinking errors she might be engaging in and instead of giving  therapist time to explain, she instead explains more of what her partner does that is bad and what she does that is good.  Undoubtedly her approach to life is more positive and she wants to be happier.  She does seem to want to achieve that without making any changes however.  CSW provided mood monitoring and treatment progress review in the context of this episode of treatment.   Patient reported that her mood has been pretty down and upset.   Patient was able to explore how her relationship is affecting her outlook on life.   CSW gave patient the opportunity to explore thoughts and feelings associated with current  life situations and past/present external stressors as desired.   CSW encouraged patient's expression of feelings and validated patient's thoughts, using empathy, active listening, open body language, and unconditional positive regard.  Patient demonstrated an orientation to time, place, person and situation.     Recommendations:  Return to therapy in 1 week, engage in self care behaviors, consider thinking errors as she goes through life circumstances in the next week  Plan: Return again in 2 weeks.  Diagnosis:  Anxiety disorder, unspecified type  Depression, unspecified depression type   Collaboration of Care: Other - none indicated at this time  Patient/Guardian was advised Release of Information must be obtained prior to any record release in order to collaborate their care with an outside provider. Patient/Guardian was advised if they have not already done so to contact the registration department to sign all necessary forms in order for [place] to release information regarding their care.   Consent: Patient/Guardian gives verbal consent for treatment and assignment of benefits for services provided during this visit. Patient/Guardian expressed understanding and agreed to proceed.   Lynnell Chad, LCSW 12/17/2022

## 2022-12-24 ENCOUNTER — Ambulatory Visit (INDEPENDENT_AMBULATORY_CARE_PROVIDER_SITE_OTHER): Payer: Medicare Other | Admitting: Clinical

## 2022-12-24 ENCOUNTER — Encounter (HOSPITAL_COMMUNITY): Payer: Self-pay | Admitting: Clinical

## 2022-12-24 DIAGNOSIS — Z630 Problems in relationship with spouse or partner: Secondary | ICD-10-CM

## 2022-12-24 DIAGNOSIS — F32A Depression, unspecified: Secondary | ICD-10-CM

## 2022-12-24 DIAGNOSIS — F419 Anxiety disorder, unspecified: Secondary | ICD-10-CM | POA: Diagnosis not present

## 2022-12-24 NOTE — Progress Notes (Signed)
   THERAPIST PROGRESS NOTE  Session Time: 11:03am-12:03pm  Session #6  Participation Level: Active  Behavioral Response: Casual and Well Groomed Alert Irritable and Hopeful  Type of Therapy: Individual Therapy  Treatment Goals addressed:  LTG: [name] will score less than 5 on the Generalized Anxiety Disorder 7 Scale (GAD-7)    STG: [name] will participate in at least 80% of scheduled individual psychotherapy sessions      ProgressTowards Goals: Progressing  Interventions: Supportive  Summary: Konni Kesinger is a 67 y.o. female who presents with increased anxiety since her partner fell and broke ribs, then went back to drinking after a particularly bad detox from alcohol. ***  Suicidal/Homicidal: No  Therapist Response: Patient is ***   Recommendations:  Return to therapy in 1 week, engage in self care behaviors, ***  Plan: Return again in 1 week.  Diagnosis:  Anxiety disorder, unspecified type  Depression, unspecified depression type  Relationship problem between partners   Collaboration of Care: Other - none indicated at this time  Patient/Guardian was advised Release of Information must be obtained prior to any record release in order to collaborate their care with an outside provider. Patient/Guardian was advised if they have not already done so to contact the registration department to sign all necessary forms in order for [place] to release information regarding their care.   Consent: Patient/Guardian gives verbal consent for treatment and assignment of benefits for services provided during this visit. Patient/Guardian expressed understanding and agreed to proceed.   Lynnell Chad, LCSW 12/24/2022

## 2022-12-31 ENCOUNTER — Ambulatory Visit (INDEPENDENT_AMBULATORY_CARE_PROVIDER_SITE_OTHER): Payer: Medicare Other | Admitting: Clinical

## 2022-12-31 ENCOUNTER — Encounter (HOSPITAL_COMMUNITY): Payer: Self-pay | Admitting: Clinical

## 2022-12-31 ENCOUNTER — Ambulatory Visit
Admission: RE | Admit: 2022-12-31 | Discharge: 2022-12-31 | Disposition: A | Discharge status: Home / Self Care | Payer: Medicare Other | Source: Ambulatory Visit | Attending: Adult Health | Admitting: Adult Health

## 2022-12-31 DIAGNOSIS — Z630 Problems in relationship with spouse or partner: Secondary | ICD-10-CM | POA: Diagnosis not present

## 2022-12-31 DIAGNOSIS — F32A Depression, unspecified: Secondary | ICD-10-CM | POA: Diagnosis not present

## 2022-12-31 DIAGNOSIS — F419 Anxiety disorder, unspecified: Secondary | ICD-10-CM | POA: Diagnosis not present

## 2022-12-31 DIAGNOSIS — Z1231 Encounter for screening mammogram for malignant neoplasm of breast: Secondary | ICD-10-CM

## 2022-12-31 NOTE — Progress Notes (Signed)
THERAPIST PROGRESS NOTE  Session Time: 11:00am-11:59am  Session #7  Participation Level: Active  Behavioral Response: Casual and Well Groomed Alert Euthymic  Type of Therapy: Individual Therapy  Treatment Goals addressed:  LTG: Melissa "[name]" will score less than 5 on the Generalized Anxiety Disorder 7 Scale (GAD-7)    STG: Melissa "[name]" will participate in at least 80% of scheduled individual psychotherapy sessions      ProgressTowards Goals: Progressing  Interventions: Solution Focused and Supportive  Summary: Melissa Holmes is a 67 y.o. female who presents with increased anxiety since her partner fell and broke ribs, then went back to drinking after a particularly bad detox from alcohol. The relationship of more than 30 years is not going well, as her partner has resumed heavy drinking, angry behavior, and constant talk about dying.  She described various outbursts that he had this week and her reactions as well as her subsequent insights and decisions.  She is looking for apartments in this area to know what is available even though she has not made any decision to leave.  She did at one point apologize to him for overreacting while not excusing his behavior.  We talked about the [employer] concept of detachment and CSW provided her with a daily reading from that organization on the topic.    We discussed the cycle she has been in with him going back many years.  Last night she overheard him calling her a "bitch" and "lazy" but then he approached her to talk repeatedly about how much he loves her and that she is the only reason he remains alive.  She stated to CSW she believes that to be true and she does understand how much he loves her because she loves him that much, at least the person he is underneath the alcohol.  She has developed an insight that because of their lengthy negative history her first reaction in disagreements is to blame him.  She also talked about gaining  insight this week that she needs to be gentle with her mother.  At CSW's encouragement she remembered the example for marriage that she saw from her parents while she lived with them for the first 27 years of her life.  Her mother was the controlling person in the relationship.  She is starting to recognize that her mother's age does give her some latitude to act in ways that may be irritating.   Suicidal/Homicidal: No  Therapist Response: Patient is making progress AEB a brighter mood, more acceptance of herself and of her situation, and listening just slightly more to suggestions provided.   CSW provided mood monitoring and treatment progress review in the context of this episode of treatment.   Patient reported that her mood has continued to improve since she is doing things for herself and by herself.  She has planned 2 trips that she will be taking by herself, one for a week to [place] to see friends and one to [place] for a weekend to see her aunt.  Patient continued to explore how she can make progress in becoming happier even if her partner does not make any changes.    CSW gave patient the opportunity to explore thoughts and feelings associated with current life situations and past/present external stressors as desired.   CSW encouraged patient's expression of feelings and validated patient's thoughts, using empathy, active listening, open body language, and unconditional positive regard.  Patient demonstrated an orientation to time, place, person and situation.  Recommendations:  Return to therapy in 1 week, engage in self care behaviors, continue to consider attending an [employer] meeting to hear more about detachment  Plan: Return again in 1 week.  Diagnosis:  Depression, unspecified depression type  Anxiety disorder, unspecified type  Relationship problem between partners   Collaboration of Care: Other - none indicated at this time  Patient/Guardian was advised Release of  Information must be obtained prior to any record release in order to collaborate their care with an outside provider. Patient/Guardian was advised if they have not already done so to contact the registration department to sign all necessary forms in order for [place] to release information regarding their care.   Consent: Patient/Guardian gives verbal consent for treatment and assignment of benefits for services provided during this visit. Patient/Guardian expressed understanding and agreed to proceed.   Lynnell Chad, LCSW 12/31/2022

## 2023-01-07 ENCOUNTER — Encounter (HOSPITAL_COMMUNITY): Payer: Self-pay | Admitting: Clinical

## 2023-01-07 ENCOUNTER — Ambulatory Visit (INDEPENDENT_AMBULATORY_CARE_PROVIDER_SITE_OTHER): Payer: Medicare Other | Admitting: Clinical

## 2023-01-07 DIAGNOSIS — Z630 Problems in relationship with spouse or partner: Secondary | ICD-10-CM | POA: Diagnosis not present

## 2023-01-07 DIAGNOSIS — F419 Anxiety disorder, unspecified: Secondary | ICD-10-CM

## 2023-01-07 DIAGNOSIS — F32A Depression, unspecified: Secondary | ICD-10-CM | POA: Diagnosis not present

## 2023-01-07 NOTE — Progress Notes (Signed)
THERAPIST PROGRESS NOTE  Session Time: 11:02am-12:02pm  Session #8  Participation Level: Active  Behavioral Response: Meticulous and Well Groomed Alert Euthymic and Calm  Type of Therapy: Individual Therapy  Treatment Goals addressed:  LTG: Melissa "[name]" will score less than 5 on the Generalized Anxiety Disorder 7 Scale (GAD-7)    STG: Melissa "[name]" will participate in at least 80% of scheduled individual psychotherapy sessions      ProgressTowards Goals: Progressing  Interventions: Solution Focused and Supportive  Summary: Melissa Holmes is a 67 y.o. female who presents with increased anxiety since her partner fell and broke ribs, then went back to drinking after a particularly bad detox from alcohol. She spent the session talking about a more detached approach she is taking with her partner, especially in reference to his drinking.  She stated that he was "hammered" when she got home from work last night and he immediately started complaining about how miserable and depressed he is, hating the state and everyone in it.  At one point he said, "Why can't you get it through your thick skull that I want to die."  He passed out at different points of the night in various rooms throughout the house.  She was able to calmly respond to various barbs and accusations by keeping an even tone and using "I" statements.  She received much support and praise from CSW for using detachment appropriately and helpfully.  She stated she is proud of herself and feels stronger each time she does this.  We briefly discussed that she has the option of petitioning him for a psychiatric evaluation when he talks about suicide while inebriated.  She has done that in the past in another state and he was able to present himself in a positive light so was not hospitalized, which is what she fears would happen again.  He already states that his love for her is the only thing keeping him alive (to which she  states, when he is suicidal, "yet that is not enough") and she fears he would react poorly if she petitioned him and he was allowed to come home.  Until and unless he receives sufficient time in treatment to see his life from a sober perspective, he is not likely to be able to see reason for her to do such a thing.  She has found out that a long-term friend's husband has only been sober the last 2 years, out of the 30 years they have been married.  Her friend is resentful of the Alcoholics Anonymous "cult" she believes her husband is in.  Patient will be going to visit this friend soon for the weekend, and the friend wanted assurance that patient will drink with the friend.  The fact that she is drinking less was discussed.   Suicidal/Homicidal: No  Therapist Response: Patient is making progress AEB her self-report that "it seems to be starting to work."  She is reacting very differently to her partner, is not becoming emotionally distraught with his actions even though she does not like them.  She is able to talk to him in more neutral terms and no longer feels she has to placate him while he is intoxicated.  She is more aware of the difference between what she can control and what she cannot control in the situation.  CSW provided mood monitoring and treatment progress review in the context of this episode of treatment.   Patient reported that her mood has been "okay".  Patient was able to explore how she has changed in the way she approaches her partner's outbursts and how those changes are actually helping with her mental health being more stable.  She was able to turn down sexual interaction with him, giving as the reason that she does not find it pleasurable to be with him intimately while he is intoxicated, and was able to maintain that stance through several incidents and continue to feel good about herself even afterward.  CSW gave patient the opportunity to explore thoughts and feelings associated  with current life situations and past/present external stressors as desired.   CSW encouraged patient's expression of feelings and validated patient's thoughts, using empathy, active listening, open body language, and unconditional positive regard.  Patient demonstrated an orientation to time, place, person and situation.        Recommendations:  Return to therapy in 1 week, engage in self care behaviors, continue to consider attending an [employer] meeting to hear more about detachment  Plan: Return again in 1 week.  Diagnosis:  Anxiety disorder, unspecified type  Depression, unspecified depression type  Relationship problem between partners   Collaboration of Care: Other - none indicated at this time  Patient/Guardian was advised Release of Information must be obtained prior to any record release in order to collaborate their care with an outside provider. Patient/Guardian was advised if they have not already done so to contact the registration department to sign all necessary forms in order for [place] to release information regarding their care.   Consent: Patient/Guardian gives verbal consent for treatment and assignment of benefits for services provided during this visit. Patient/Guardian expressed understanding and agreed to proceed.   Lynnell Chad, LCSW 01/07/2023

## 2023-01-08 DIAGNOSIS — H524 Presbyopia: Secondary | ICD-10-CM | POA: Diagnosis not present

## 2023-01-08 DIAGNOSIS — H31002 Unspecified chorioretinal scars, left eye: Secondary | ICD-10-CM | POA: Diagnosis not present

## 2023-01-08 DIAGNOSIS — H5203 Hypermetropia, bilateral: Secondary | ICD-10-CM | POA: Diagnosis not present

## 2023-01-12 ENCOUNTER — Ambulatory Visit (INDEPENDENT_AMBULATORY_CARE_PROVIDER_SITE_OTHER): Payer: Medicare Other | Admitting: Clinical

## 2023-01-12 ENCOUNTER — Encounter (HOSPITAL_COMMUNITY): Payer: Self-pay | Admitting: Clinical

## 2023-01-12 DIAGNOSIS — Z630 Problems in relationship with spouse or partner: Secondary | ICD-10-CM | POA: Diagnosis not present

## 2023-01-12 DIAGNOSIS — F32A Depression, unspecified: Secondary | ICD-10-CM

## 2023-01-12 DIAGNOSIS — F419 Anxiety disorder, unspecified: Secondary | ICD-10-CM | POA: Diagnosis not present

## 2023-01-12 NOTE — Progress Notes (Signed)
THERAPIST PROGRESS NOTE  Session Time: 11:05am-12:03pm  Session #9  Participation Level: Active  Behavioral Response: Casual and Well Groomed Alert Irritable  Type of Therapy: Individual Therapy  Treatment Goals addressed:  LTG: Melissa "[name]" will score less than 5 on the Generalized Anxiety Disorder 7 Scale (GAD-7)    STG: Melissa "[name]" will participate in at least 80% of scheduled individual psychotherapy sessions      ProgressTowards Goals: Progressing  Interventions: Assertiveness Training and Anger Management Training  Summary: Melissa Holmes is a 67 y.o. female who presents with increased anxiety since her partner fell and broke ribs, then went back to drinking after a particularly bad detox from alcohol. She talked about the eventful week she had and described in detail the days following her last session, during which she became quite angry with her partner, citing "only 1 major blow-up."  She said he was initially apologetic in the hours after she returned from therapy, sorry for his drunken behavior the night before.  The next day she wanted to be up early to be ready for the workers coming to install their new flooring.  However, he woke her up 45 minutes late.  This let to a series of other events, after which she expressed loud, unhappy thoughts to him.  CSW provided her with a copy of a worksheet called Anger Iceberg and described how anger is always fueled by other feelings.  She asked to borrow a highlighter and was immediately able to identify the emotions underlying her anger that day and many others.  CSW then challenged her to think about whether it was legitimate to be angry at her partner for getting her up late when in fact she did not ask him to get her up at all.  She reacted to this as if it was an Oceanographer.  CSW asked her if she could simply learn from this situation about how to do one thing slightly different in the future, and she replied with  assurances that she could.  She continues to read on-line about other people's experiences with the alcoholics in their lives and sees much in common with them,still has not gone to an Al-Anon meeting however.  She does intend to do so.  She found herself on the phone with her best friend recently, just listening to her friend's issues and not talking that much.  She felt positively about this experience and said she did not realize how much pain her friend had before this.   Suicidal/Homicidal: No  Therapist Response: Patient is making progress AEB growing insight into her own behavior and responses, as well as even her thinking processes.  She is experiencing pride at the growth in the ways she responds and how she sometimes can refrain from reacting in an old way.  CSW gave patient the opportunity to explore thoughts and feelings associated with current life situations and past/present external stressors as desired.   CSW encouraged patient's expression of feelings and validated patient's thoughts, using empathy, active listening, open body language, and unconditional positive regard.  Patient demonstrated an orientation to time, place, person and situation.     Recommendations:  Return to therapy in 1 week, engage in self care behaviors, continue to consider attending an Al-Anon meeting to hear more about detachment  Plan: Return again in 1 week.  Diagnosis:  Anxiety disorder, unspecified type  Depression, unspecified depression type  Relationship problem between partners   Collaboration of Care: Other - none indicated at  this time  Patient/Guardian was advised Release of Information must be obtained prior to any record release in order to collaborate their care with an outside provider. Patient/Guardian was advised if they have not already done so to contact the registration department to sign all necessary forms in order for [place] to release information regarding their care.   Consent:  Patient/Guardian gives verbal consent for treatment and assignment of benefits for services provided during this visit. Patient/Guardian expressed understanding and agreed to proceed.   Lynnell Chad, LCSW 01/12/2023

## 2023-01-19 ENCOUNTER — Ambulatory Visit (INDEPENDENT_AMBULATORY_CARE_PROVIDER_SITE_OTHER): Payer: Medicare Other | Admitting: Clinical

## 2023-01-19 ENCOUNTER — Encounter (HOSPITAL_COMMUNITY): Payer: Self-pay | Admitting: Clinical

## 2023-01-19 DIAGNOSIS — F32A Depression, unspecified: Secondary | ICD-10-CM | POA: Diagnosis not present

## 2023-01-19 DIAGNOSIS — F419 Anxiety disorder, unspecified: Secondary | ICD-10-CM

## 2023-01-19 NOTE — Progress Notes (Signed)
   THERAPIST PROGRESS NOTE  Session Time: 11:00-2:00pm  Session #10  Participation Level: Active  Behavioral Response: Casual and Well Groomed Alert Angry and Anxious  Type of Therapy: Individual Therapy  Treatment Goals addressed:  LTG: [name] will score less than 5 on the Generalized Anxiety Disorder 7 Scale (GAD-7)    STG: [name] will participate in at least 80% of scheduled individual psychotherapy sessions     ADD GOALS  ProgressTowards Goals: Progressing  Interventions: Assertiveness Training and Anger Management Training  Summary: Melissa Holmes is a 67 y.o. female who presents with increased anxiety since her partner fell and broke ribs, then went back to drinking after a particularly bad detox from alcohol. ***   Suicidal/Homicidal: No  Therapist Response: Patient is making progress AEB ***   Recommendations:  Return to therapy in 2-3 weeks, engage in self care behaviors, continue to consider attending an Al-Anon meeting, practice saying "no thank you"  Plan: Return again in 2-3 weeks  Next appointment:  02/18/23 at 11am  Diagnosis:  Anxiety disorder, unspecified type  Depression, unspecified depression type   Collaboration of Care: Other - none indicated at this time  Patient/Guardian was advised Release of Information must be obtained prior to any record release in order to collaborate their care with an outside provider. Patient/Guardian was advised if they have not already done so to contact the registration department to sign all necessary forms in order for [place] to release information regarding their care.   Consent: Patient/Guardian gives verbal consent for treatment and assignment of benefits for services provided during this visit. Patient/Guardian expressed understanding and agreed to proceed.   Lynnell Chad, LCSW 01/19/2023

## 2023-01-26 ENCOUNTER — Ambulatory Visit (HOSPITAL_COMMUNITY): Payer: Medicare Other | Admitting: Clinical

## 2023-01-29 ENCOUNTER — Telehealth: Payer: Self-pay | Admitting: Adult Health

## 2023-01-29 NOTE — Telephone Encounter (Signed)
Prescription Request  01/29/2023  LOV: 07/17/2022  What is the name of the medication or equipment? ALPRAZolam Prudy Feeler) 1 MG tablet   Have you contacted your pharmacy to request a refill? No   Which pharmacy would you like this sent to?  [id number] [place], [place] - [address] AT [facility] [address] [facility] [address] [place] [address] Phone: [phone] Fax: [phone]    Patient notified that their request is being sent to the clinical staff for review and that they should receive a response within 2 business days.   Please advise at Mobile [phone] (mobile)

## 2023-01-29 NOTE — Telephone Encounter (Signed)
Okay for refill?  

## 2023-01-30 ENCOUNTER — Other Ambulatory Visit: Payer: Self-pay | Admitting: Adult Health

## 2023-01-30 DIAGNOSIS — F419 Anxiety disorder, unspecified: Secondary | ICD-10-CM

## 2023-01-30 MED ORDER — ALPRAZOLAM 1 MG PO TABS: ORAL_TABLET | ORAL | 2 refills | Status: DC

## 2023-01-30 NOTE — Telephone Encounter (Signed)
Spoke to pt. Pt is aware the rx is sent in.

## 2023-02-04 ENCOUNTER — Ambulatory Visit (INDEPENDENT_AMBULATORY_CARE_PROVIDER_SITE_OTHER): Payer: Medicare Other | Admitting: Clinical

## 2023-02-04 ENCOUNTER — Encounter (HOSPITAL_COMMUNITY): Payer: Self-pay | Admitting: Clinical

## 2023-02-04 DIAGNOSIS — F419 Anxiety disorder, unspecified: Secondary | ICD-10-CM

## 2023-02-04 DIAGNOSIS — F331 Major depressive disorder, recurrent, moderate: Secondary | ICD-10-CM | POA: Diagnosis not present

## 2023-02-04 DIAGNOSIS — Z630 Problems in relationship with spouse or partner: Secondary | ICD-10-CM

## 2023-02-04 NOTE — Progress Notes (Signed)
THERAPIST PROGRESS NOTE  Session Time: 11:10am-12:10pm  Session #11  Participation Level: Active  Behavioral Response: Casual and Well Groomed Alert Depressed and tearful  Type of Therapy: Individual Therapy  Treatment Goals addressed:  LTG: Melissa "[name]" will score less than 5 on the Generalized Anxiety Disorder 7 Scale (GAD-7)    STG: Melissa "[name]" will participate in at least 80% of scheduled individual psychotherapy sessions       ProgressTowards Goals: Progressing  Interventions: CBT, Assertiveness Training, and Supportive  Summary: Melissa Holmes is a 67 y.o. female who presents with increased anxiety since her partner fell and broke ribs, then went back to drinking after a particularly bad detox from alcohol. Much of today's session was spent in her recounting situations that have happened recently with her long-term partner and the demeaning way in which he has talked to her and treated her.  She reported that even though CSW had provided her a reading about detachment that came from Al-Anon several weeks ago, she just now took the time to read it, stating that it has changed how she is viewing the situation.  Her partner keeps asking if she will be okay living without him, is passive aggressive with her and then actually aggressive.  CSW supported her emotionally by sharing that indeed the things she is describing, if this is accurately how he is acting and what he is saying, it is unacceptable and boundaries are needed.  For his birthday he told her did not want her to do anything so she wanted to know what to do, and CSW advised her to honor his wishes and do nothing.  She reported he is now drinking about 2 bottles of wine each and every day, to the extent that even riding in an elevator with him she could smell the alcohol.  She stated she "almost" went to an Al-Anon meeting but remains fearful of the "higher power" talk.  She has indeed looked for apartments where  she might move, stating that if she is the one that moves out, he will be hounded by the other residents until he moves out and she moves back in.  This is because of her being on the [facility] Board and well-known.  CSW asked her what would have to happen for her to take the step of refusing to have him around her due to his drinking, and she stated she is "almost there," did not provide a specific response.  She stated since he was hospitalized and almost died, they have had 3 major blow-ups but this was the worst.  She became quite tearful and said in 30 years together she has never met his family.  Dealing with him is starting to make her so nervous she shakes physically.  She believe it is going to end "sadly."  CSW expressed the observation that her depression seems worse, and that perhaps she needs to see the psychiatric provider; however, she said it has always been this bad, but she is now starting to reveal the complete truth.  She agreed to think about Al-Anon (higher power as a concept was discussed briefly) and to consider seeing a psychiatrist.   Suicidal/Homicidal: No  Therapist Response: Patient is making progress AEB starting to reveal more of the truth of how badly she actually feels, how bad things are in the home, and how depressed she really is.  PHQ-9 and GAD-7 scores were relatively low in the beginning -- unfortunately if they were administered now it  seems they might be higher; however, this would be due to her becoming more willing to trust therapist and be truthful.  CSW provided unconditional regard, encouragement about her deserving to be spoken to respectfully, empathy about her current circumstance, hope for the future.  She was oriented x5.  She expressed no SI/HI.    Recommendations:  Return to therapy in 2-3 weeks, engage in self care behaviors, continue to consider attending an Al-Anon meeting, consider telling partner not to talk to her in that manner, consider seeing  psychiatrist  Plan: Return again in 2-3 weeks  Next appointment:  02/18/23 at 11am  Diagnosis:  Moderate episode of recurrent major depressive disorder (HCC)  Anxiety disorder, unspecified type  Relationship problem between partners   Collaboration of Care: Psychiatrist AEB - encouraged patient to make a psychiatric appointment regarding possible antidepressant/anti-anxiety  med  Patient/Guardian was advised Release of Information must be obtained prior to any record release in order to collaborate their care with an outside provider. Patient/Guardian was advised if they have not already done so to contact the registration department to sign all necessary forms in order for [place] to release information regarding their care.   Consent: Patient/Guardian gives verbal consent for treatment and assignment of benefits for services provided during this visit. Patient/Guardian expressed understanding and agreed to proceed.   Lynnell Chad, LCSW 02/04/2023

## 2023-02-18 ENCOUNTER — Encounter (HOSPITAL_COMMUNITY): Payer: Self-pay | Admitting: Clinical

## 2023-02-18 ENCOUNTER — Inpatient Hospital Stay: Payer: Medicare Other

## 2023-02-18 ENCOUNTER — Ambulatory Visit (INDEPENDENT_AMBULATORY_CARE_PROVIDER_SITE_OTHER): Payer: Medicare Other | Admitting: Clinical

## 2023-02-18 ENCOUNTER — Inpatient Hospital Stay: Payer: Medicare Other | Attending: Gynecologic Oncology | Admitting: Gynecologic Oncology

## 2023-02-18 VITALS — BP 104/54 | HR 62 | Temp 98.7°F | Resp 16 | Ht 64.0 in | Wt 108.0 lb

## 2023-02-18 DIAGNOSIS — R634 Abnormal weight loss: Secondary | ICD-10-CM | POA: Diagnosis not present

## 2023-02-18 DIAGNOSIS — Z90722 Acquired absence of ovaries, bilateral: Secondary | ICD-10-CM | POA: Insufficient documentation

## 2023-02-18 DIAGNOSIS — Z630 Problems in relationship with spouse or partner: Secondary | ICD-10-CM

## 2023-02-18 DIAGNOSIS — R5383 Other fatigue: Secondary | ICD-10-CM | POA: Diagnosis not present

## 2023-02-18 DIAGNOSIS — F331 Major depressive disorder, recurrent, moderate: Secondary | ICD-10-CM

## 2023-02-18 DIAGNOSIS — Z90710 Acquired absence of both cervix and uterus: Secondary | ICD-10-CM | POA: Insufficient documentation

## 2023-02-18 DIAGNOSIS — F419 Anxiety disorder, unspecified: Secondary | ICD-10-CM | POA: Diagnosis not present

## 2023-02-18 DIAGNOSIS — R3915 Urgency of urination: Secondary | ICD-10-CM

## 2023-02-18 DIAGNOSIS — R3989 Other symptoms and signs involving the genitourinary system: Secondary | ICD-10-CM

## 2023-02-18 DIAGNOSIS — Z8543 Personal history of malignant neoplasm of ovary: Secondary | ICD-10-CM | POA: Diagnosis present

## 2023-02-18 DIAGNOSIS — C569 Malignant neoplasm of unspecified ovary: Secondary | ICD-10-CM

## 2023-02-18 DIAGNOSIS — F17210 Nicotine dependence, cigarettes, uncomplicated: Secondary | ICD-10-CM | POA: Insufficient documentation

## 2023-02-18 DIAGNOSIS — N907 Vulvar cyst: Secondary | ICD-10-CM | POA: Insufficient documentation

## 2023-02-18 DIAGNOSIS — R350 Frequency of micturition: Secondary | ICD-10-CM | POA: Insufficient documentation

## 2023-02-18 LAB — URINALYSIS, COMPLETE (UACMP) WITH MICROSCOPIC
Bacteria, UA: NONE SEEN
Bilirubin Urine: NEGATIVE
Glucose, UA: NEGATIVE mg/dL
Hgb urine dipstick: NEGATIVE
Ketones, ur: NEGATIVE mg/dL
Nitrite: NEGATIVE
Protein, ur: NEGATIVE mg/dL
Specific Gravity, Urine: 1.01 (ref 1.005–1.030)
pH: 5 (ref 5.0–8.0)

## 2023-02-18 NOTE — Patient Instructions (Addendum)
Your exam today is normal with no concerning findings. We will contact you with the results of your CA 125 from today. Plan to follow up in six months with repeat CA 125 lab draw at that time or sooner if needed.  We will let you know when your urine results return. If your symptoms of bladder pressure persist, we can always discuss obtaining imaging for further evaluation.  On today's exam, there is a tiny cyst on the right inner labia at the entrance of the vagina. This does not appear infected or inflamed. You can apply a warm compress at bedtime to this area for the next week or so to assist with your body reabsorbing the fluid.   I would recommend seeing a dermatologist for the area on your left arm.    Symptoms to report to your health care team include vaginal bleeding, rectal bleeding, bloating, weight loss without effort, new and persistent pain, new and  persistent fatigue, new leg swelling, new masses (i.e., bumps in your neck or groin), new and persistent cough, new and persistent nausea and vomiting, change in bowel or bladder habits, and any other concerns.

## 2023-02-18 NOTE — Progress Notes (Signed)
Gynecologic Oncology Follow Up  Consult was requested by Duane Lope for the evaluation of Melissa Holmes 67 y.o. 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  Assessment/Plan: Melissa Holmes is a 67 y.o. 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  No evidence of recurrence/disease on today's exam.   She will be contacted with the results of her CA 125 drawn today. Urine sample obtained today to rule out infection. Given weight loss and urinary symptoms, we discussed possibility of scan in the future if needed. Discussed warm compresses to the vulva to assist with reabsorption of simple, small cyst.  Signs and symptoms of recurrence given in after visit summary. Follow up recommendations include office visits at 6 monthly intervals for surveillance exams and CA 125 assessments until December, 2024. She is advised to call for any needs, concerns, or new symptoms.   HPI: Melissa Holmes is a 67 year old female P0 initially seen in consultation at the request of Duane Lope NP for evaluation of a history of right ovarian cancer diagnosed in 2019.  Per Dr. Andrey Farmer: In September 2019 when living in [place], she developed right lower quadrant pain and GI distress. She was seen in the emergency department and was operated on with a laparoscopic appendectomy on Labor Day weekend.  Approximately 1 month following the surgery, she developed new right lower quadrant pain and indigestion and was seen again in the emergency department. At this time, she was diagnosed with a right ovarian cystic mass that measured approximately 8 cm and was complex cyst, cystic and solid.  She was taken to the operating room for laparoscopic BSO on July 01, 2018.  Pathology from this specimen revealed a FIGO grade 1 endometrioid adenocarcinoma of the right  ovary with no surface involvement.  She met with Dr. Etter Sjogren at [facility] oncology in [facility] for definitive surgical staging that was performed on 07/21/2018.  This included a robotic hysterectomy, pelvic and para-aortic lymphadenectomy, omentectomy, peritoneal biopsies.  No residual carcinoma or metastatic carcinoma was identified in any of the specimens.  Complex atypical hyperplasia was noted within the endometrium with no invasion present.  This was felt to be unrelated to her ovarian carcinoma.  She received definitive staging as a Stage Ia grade 1 endometrioid adenocarcinoma of the right ovary.  She reported undergoing genetic testing at [facility] in [place] at the cancer center.  She reported this was negative for BRCA mutations.  No adjuvant therapy was recommended due to her early stage low-grade lesion.  She did not present for typical in person follow-up due to the emergence of the COVID-19 pandemic.  However she did intermittently have virtual visits with a general medical oncologist and had Ca 125 lab evaluations at 3 monthly intervals.  She relocated from [place] to [place] in the summer 2021 in order to be closer in proximity to her elderly mother.  At that time she established care with Duane Lope NP. Ca 125 was last drawn on 08/2022 and was normal at 10.  Interval Hx: She presents today to the office for continued follow-up.  She has had life stressors including assisting with caring for her 82 year old mother and her husband who recently had a fall, fractured ribs with hospitalization, went through detox.  This has been a very stressful time for her.  She has stopped drinking alcohol on a regular basis and has lost around 8 pounds.  She has been eating the same foods and continues to work 2 days a week. She has had phlegm in her throat, noting this when waking up in the middle the night. No cough or chest pain.  No abdominal  pain, bloating, early satiety reported.  She has a cystlike area on her right foot at the posterior aspect of large toe that she has plans to see a podiatrist for.  She has a nonhealing skin lesion on her left forearm that she will plan to see a dermatologist for.  She continues to read on a regular basis.  She does report urinary urgency and bladder pressure.  No dysuria or hematuria reported.  She does feel like she is emptying her bladder with urination.  No vaginal bleeding or discharge reported.  Bowels are functioning normally for her.  She reports intermittent cramping/shooting pains in her hands and feet. She has been seeing a therapist since February and feels like this helps tremendously.  Current Meds:  Outpatient Encounter Medications as of 02/18/2023  Medication Sig   ALPRAZolam (XANAX) 1 MG tablet TAKE 1 TABLET(1 MG) BY MOUTH DAILY AS NEEDED FOR ANXIETY   ASCORBIC ACID PO Take by mouth daily in the afternoon.   Cholecalciferol (D3 VITAMIN PO) Take by mouth daily in the afternoon.   Lactobacillus (PROBIOTIC ACIDOPHILUS PO) Take by mouth daily in the afternoon.   Lysine 1000 MG TABS Take by mouth.   No facility-administered encounter medications on file as of 02/18/2023.    Allergy: No Known Allergies  Social Hx:   Social History   Socioeconomic History   Marital status: Married    Spouse name: Not on file   Number of children: Not on file   Years of education: Not on file   Highest education level: Not on file  Occupational History   Not on file  Tobacco Use   Smoking status: Every Day    Types: Cigarettes   Smokeless tobacco: Never   Tobacco comments:    2 cigarettes daily  Vaping Use   Vaping Use: Never used  Substance and Sexual Activity   Alcohol use: Yes    Comment: Occ wine   Drug use: Not Currently   Sexual activity: Not Currently  Other Topics Concern   Not on file  Social History Narrative   Not on file   Social Determinants of Health   Financial  Resource Strain: Low Risk  (06/11/2022)   Overall Financial Resource Strain (CARDIA)    Difficulty of Paying Living Expenses: Not hard at all  Food Insecurity: No Food Insecurity (06/11/2022)   Hunger Vital Sign    Worried About Running Out of Food in the Last Year: Never true    Ran Out of Food in the Last Year: Never true  Transportation Needs: No Transportation Needs (06/11/2022)   PRAPARE - Administrator, Civil Service (Medical): No    Lack of Transportation (Non-Medical): No  Physical Activity: Sufficiently Active (06/11/2022)   Exercise Vital Sign    Days of Exercise per Week: 5 days    Minutes of Exercise per Session: 30 min  Stress: No Stress Concern Present (06/11/2022)   [employer] of Occupational Health - Occupational Stress Questionnaire    Feeling of Stress : Not at all  Social Connections: Moderately Isolated (06/11/2022)   Social Connection and Isolation Panel [NHANES]    Frequency of  Communication with Friends and Family: More than three times a week    Frequency of Social Gatherings with Friends and Family: More than three times a week    Attends Religious Services: Never    Database administrator or Organizations: No    Attends Banker Meetings: Never    Marital Status: Married  Catering manager Violence: Not At Risk (06/11/2022)   Humiliation, Afraid, Rape, and Kick questionnaire    Fear of Current or Ex-Partner: No    Emotionally Abused: No    Physically Abused: No    Sexually Abused: No    Past Surgical Hx:  Past Surgical History:  Procedure Laterality Date   ABDOMINAL HYSTERECTOMY  2019   APPENDECTOMY  2019   BREAST BIOPSY Right 2019   benign per patient   TONSILLECTOMY  1965    Past Medical Hx:  Past Medical History:  Diagnosis Date   Allergy    Colon polyps    history of precancerous   History of chicken pox    Ovarian cancer (HCC)    diagnosed 2 years ago-treated by a physician in [place]    Past Gynecological  History:  See HPI, G0 No LMP recorded. Patient has had a hysterectomy.  Family Hx:  Family History  Problem Relation Age of Onset   Arthritis Mother    Diabetes Mother    Hearing loss Mother    Hypertension Mother    Hyperlipidemia Mother    Depression Father    Heart disease Father    Heart attack Father    Multiple myeloma Brother    Early death Brother    Hearing loss Maternal Grandmother    Alcohol abuse Maternal Grandfather    Cancer Maternal Grandfather    Cancer Paternal Grandmother    Early death Paternal Grandmother    Miscarriages / Stillbirths Paternal Grandmother    Diabetes Paternal Grandfather    Colon cancer Neg Hx    Breast cancer Neg Hx    Ovarian cancer Neg Hx    Endometrial cancer Neg Hx    Pancreatic cancer Neg Hx    Prostate cancer Neg Hx     Mammogram 12/2022 Bone density 12/2022 Colonoscopy in 2019  Review of Systems: ROS intake form positive for urinary frequency, decreased concentration, anxiety, fatigue, unexplained weight loss.  Constitutional: Feels under stress. Has had weight loss, possibly related to decreasing alcohol intake. No early satiety. No abnormal bloating symptoms.    ENT: Normal appearing ears and nares bilaterally Skin/Breast: Non-healing skin lesion on left forearm. No rash, jaundice, itching, dryness Cardiovascular: No chest pain, shortness of breath, or edema  Pulmonary: No cough or wheeze.  Gastro Intestinal: No nausea, vomiting, or diarrhea. No bright red blood per rectum, no abdominal pain, change in bowel movement, or constipation.  Genito Urinary: Positive for urgency. No dysuria, no bleeding Musculo Skeletal: Occasional cramping/shooting pains in hands and feet. No new myalgia, arthralgia, joint swelling or pain  Neurologic: No weakness, numbness, change in gait   Vitals:  Blood pressure (!) 104/54, pulse 62, temperature 98.7 F (37.1 C), temperature source Oral, resp. rate 16, height 5\' 4"  (1.626 m), weight 108 lb (49  kg), SpO2 99 %.  Physical Exam: General: Well developed, well nourished female in no acute distress. Alert and oriented x 3.  Neck: Supple without any enlargements.  Lymph node survey: No cervical, supraclavicular, or inguinal adenopathy.  Cardiovascular: Regular rate and rhythm. S1 and S2 normal.  Lungs: Clear to auscultation  bilaterally. No wheezes/crackles/rhonchi noted.  Skin: No rashes present. 0.5 cm skin lesion noted on the left forearm-no surrounding erythema or drainage. Back: No CVA tenderness.  Abdomen: Abdomen soft, non-tender and non-obese. Active bowel sounds in all quadrants. No evidence of a fluid wave or abdominal masses. Incisions well healed without nodularity.  Genitourinary:    Vulva/vagina: Normal external female genitalia. No lesions. On the right vulva at the introitus, there is a fluid filled, smooth cyst 0.25 cm. No drainage or surrounding erythema. The urethra is not obstructed by this.    Urethra: No lesions or masses    Vagina: Atrophic without any lesions. No palpable masses. No vaginal bleeding or drainage noted.  Rectal: Good tone, no masses, no cul de sac nodularity.  Extremities: No bilateral cyanosis, edema, or clubbing.    Doylene Bode, NP  02/18/2023, 4:43 PM

## 2023-02-18 NOTE — Progress Notes (Signed)
THERAPIST PROGRESS NOTE  Session Time: 11:03am-12:03pm  Session #12  Participation Level: Active  Behavioral Response: Casual and Well Groomed Alert Euthymic  Type of Therapy: Individual Therapy  Treatment Goals addressed:  LTG: Melissa "[name]" will score less than 5 on the Generalized Anxiety Disorder 7 Scale (GAD-7)    STG: Melissa "[name]" will participate in at least 80% of scheduled individual psychotherapy sessions      ProgressTowards Goals: Progressing  Interventions: CBT, Assertiveness Training, and Supportive  Summary: Melissa Holmes is a 67 y.o. female who presents with increased anxiety since her partner fell and broke ribs, then went back to drinking after a particularly bad detox from alcohol. She came with a written list of things to share, most of which had to do with how she has followed through on setting boundaries with her partner and mother since last session.  CSW reminded her that boundaries are both to keep others far enough away to prevent harm, as well as to keep ourselves in to prevent [place] going to the harm.  She had forgotten this and liked the concept.    She described her mother's doctor appointment yesterday and said as though surprised that the doctor seemed to be saying that some things are wrong physically with her mother, but she is of an age where they will just let it happen naturally.  When CSW spoke about the inevitability of death, she asked for the "puke pail."  We discussed anticipatory grief and how she has an opportunity now to do things in a way that she will not later regret.    Her partner has been incredulous that she will not cook for him and he obviously does not remember the cruel statements he made to her over a 3-week period about many things, but specifically about the food she would prepare for him.  He has said several times, "What did I possibly say for you to act this way?"   As she has continued to only prepare food for  herself, but is willing to tell him what is available at home for him to have if he wants, he often challenges her with "Why can't we...." something such as split a frozen dinner.  She gets quite irritated with this, was informed that one way of setting a boundary is to just not answer.  She was somewhat embarrassed to report that she "caved" and set a dentist appointment for her partner.  CSW assured her that there is no judgment in this room.  He was angry that she did not plan to go with him, so in an act of rebellion, she did go to the appointment, albeit separately.  The dentist refused to take him as a patient because of the level of difficulty his case will be to refit for dentures since he has worn down his gums by never taking them out.  They were referred to an oral surgeon and prosthodontist.  When asked how it has felt setting her boundaries and maintaining them, she stated she has been nauseous for 2 weeks.  She does feel good that she has not been yelling at her partner.  She has continued to read comments from mostly women with alcoholic husbands and has pretty much decided that she is an individual who would not benefit from [employer], mostly because of her stance on the idea of a Higher Power.  She was encouraged, nonetheless, to download a free copy of the Big Book in order to  see the descriptions from 100 years ago of alcoholism from the alcoholic's point of view.   Suicidal/Homicidal: No  Therapist Response: Patient is progressing AEB engaging in scheduled therapy session.  She presented oriented x5 and stated she was feeling "nauseous."  CSW evaluated patient's self-care since last session.  CSW reviewed the last session with patient who reported she has not enjoyed it but she has set and maintained boundaries..  Patient stated she has not cooked for her partner since our last session.    Throughout the session, CSW gave patient the opportunity to explore thoughts and feelings associated  with current life situations and past/present external stressors.   CSW encouraged patient's expression of feelings and validated patient's thoughts using empathy, active listening, open body language, and unconditional positive regard.       Recommendations:  Return to therapy in 2-3 weeks, engage in self care behaviors, continue to maintain boundaries   Plan: Return again in 2-3 weeks  Next appointment:  6/19  Diagnosis:  Moderate episode of recurrent major depressive disorder (HCC)  Anxiety disorder, unspecified type  Relationship problem between partners   Collaboration of Care: Patient refused AEB - not interested in medicines right now    Patient/Guardian was advised Release of Information must be obtained prior to any record release in order to collaborate their care with an outside provider. Patient/Guardian was advised if they have not already done so to contact the registration department to sign all necessary forms in order for [place] to release information regarding their care.   Consent: Patient/Guardian gives verbal consent for treatment and assignment of benefits for services provided during this visit. Patient/Guardian expressed understanding and agreed to proceed.   Lynnell Chad, LCSW 02/18/2023

## 2023-02-19 ENCOUNTER — Telehealth: Payer: Self-pay | Admitting: Surgery

## 2023-02-19 ENCOUNTER — Other Ambulatory Visit: Payer: Self-pay | Admitting: Gynecologic Oncology

## 2023-02-19 DIAGNOSIS — R3915 Urgency of urination: Secondary | ICD-10-CM

## 2023-02-19 LAB — CA 125: Cancer Antigen (CA) 125: 9 U/mL (ref 0.0–38.1)

## 2023-02-19 NOTE — Telephone Encounter (Signed)
Called patient to review [place] and CA 125 results. Reviewed with patient notes on lab results and advised patient that urine culture added to specimen given yesterday to further check for infection. Our office will call back once urine culture has resulted. Patient thanked me for this information and had no other concerns.

## 2023-02-19 NOTE — Progress Notes (Signed)
Adding on urine culture

## 2023-02-20 ENCOUNTER — Telehealth: Payer: Self-pay | Admitting: *Deleted

## 2023-02-20 ENCOUNTER — Other Ambulatory Visit: Payer: Self-pay | Admitting: Gynecologic Oncology

## 2023-02-20 DIAGNOSIS — R3129 Other microscopic hematuria: Secondary | ICD-10-CM

## 2023-02-20 LAB — URINE CULTURE: Culture: NO GROWTH

## 2023-02-20 NOTE — Telephone Encounter (Signed)
-----   Message from Doylene Bode, NP sent at 02/20/2023  1:53 PM EDT ----- Please call patient and let her know no infection with urine culture.  Please let her know we will plan for repeat urine sample in about 2 to 3 weeks to retest her urine to see if there is any blood present.  The blood that was present in the previous sample could be related to having an exam right before that but we would like to rule out any other causes.  Please let her know to hydrate over these next couple weeks.  I would like for her to get the urine sample back in our office.  I would like some of the urine to be sent for analysis and then our office to hold an additional sample in case urine cytology is needed.  In order to do this she may need a nursing evaluation visit to obtain the urine in the office and not at lab

## 2023-02-20 NOTE — Telephone Encounter (Signed)
Spoke with Ms. Demarse and relayed message from Warner Mccreedy, NP that her urine culture was negative for infection. Advised patient to plan for a repeat urine sample to see if there is any blood present. The blood that was present in the previous sample could be related to having the exam right before and we would like to rule out any other causes. Instructed patient to keep herself hydrated over the next few weeks.  Patient was given a nurse visit on Tuesday, July 2nd at 1100 for a urine sample to be sent for analysis and the office would hold an additional sample in case urine cytology is needed.  Patient thanked the office for calling and agreed to date and time of nurse appointment, no further questions or concerns at this time.

## 2023-02-23 DIAGNOSIS — R3915 Urgency of urination: Secondary | ICD-10-CM | POA: Insufficient documentation

## 2023-02-23 DIAGNOSIS — R634 Abnormal weight loss: Secondary | ICD-10-CM | POA: Insufficient documentation

## 2023-02-25 ENCOUNTER — Encounter (HOSPITAL_COMMUNITY): Payer: Self-pay | Admitting: Clinical

## 2023-02-25 ENCOUNTER — Ambulatory Visit (INDEPENDENT_AMBULATORY_CARE_PROVIDER_SITE_OTHER): Payer: Medicare Other | Admitting: Clinical

## 2023-02-25 DIAGNOSIS — Z630 Problems in relationship with spouse or partner: Secondary | ICD-10-CM | POA: Diagnosis not present

## 2023-02-25 DIAGNOSIS — F419 Anxiety disorder, unspecified: Secondary | ICD-10-CM | POA: Diagnosis not present

## 2023-02-25 DIAGNOSIS — F331 Major depressive disorder, recurrent, moderate: Secondary | ICD-10-CM

## 2023-02-25 NOTE — Progress Notes (Signed)
THERAPIST PROGRESS NOTE  Session Time: 11:05am-12:05pm  Session #13  Participation Level: Active  Behavioral Response: Casual and Well Groomed Alert Euthymic  Type of Therapy: Individual Therapy  Treatment Goals addressed:  LTG: [name] will score less than 5 on the Generalized Anxiety Disorder 7 Scale (GAD-7)    STG: [name] will participate in at least 80% of scheduled individual psychotherapy sessions      ProgressTowards Goals: Progressing  Interventions: Strength-based and Supportive  Summary: Melissa Holmes is a 67 y.o. female who presents with increased anxiety since her partner fell and broke ribs, then went back to drinking after a particularly bad detox from alcohol. Patient described in detail chores she has done recently for partner and mother, with the importance and how each things relates to her mental health hidden somewhere toward the end of each story.  She reported that her 95yo mother still has a neat home but it is not in the good condition that it has always been for the entirety of patient's life.  There is dust accumulating and there are cobwebs in the corners.  She has considered sneaking into mother's house and cleaning it while mother is not there or trying to get her mother to hire somebody to come in and clean.  CSW encouraged her to refrain from sneaking in to clean, as this would be a violation of privacy and would actually propagate a lie.  She agreed to comply with this recommendation.  CSW suggested a number of techniques to put forth the idea of hiring help, then demonstrated these communication skills.  She shared that she is afraid of "pushing" her mother about hiring someone because she does not want to cause her mother to have a heart attack.  She also shared that she has had dreams of finding her mother dead.  We discussed the concept of anticipatory grief.    She also talked about how hard it continues to be to be around her  partner and related several stories of events and how she has handled them.  She feels good about the boundaries she has set.  She continues not to cook for him, but will prepare a dish for him out of food that is already made.  He continues to drink heavily.  She has told him that she likes doing things with him, but it is not enjoyable when he is drunk.  She does not believe he has heard her, based on her ongoing behavior.  She related the discussion about anticipatory grief to her partner since he is always telling her she will be okay without him when he is gone.  Patient said her brain still feels like it goes in circles and she is having crying bouts.  We discussed the possibility of medication being needed, but she again deferred, stating that she feels the depression is situational.  She will be going to [place] in a few days to stay for a week, wondered if she should contact friends she has not yet contacted.  CSW encouraged her to do so, reminding her of the need for social supports.   Suicidal/Homicidal: No  Therapist Response: Patient is progressing AEB engaging in scheduled therapy session.  She presented oriented x5 and stated she was feeling "okay."  She is still having occasional crying bouts. She again declined a psychiatric evaluation for medications, stating the depression she is experiencing is situational and only occurs in the home with her partner present.  Other times,  she feels fine.   CSW evaluated patient's self-care since last session which appears to continue to be good.  CSW reviewed the last session with patient who reported she has continued to maintain the position that she will not cook for her partner, although she has relented and started to fix foods that are already prepared such as frozen dinners and take-out.  CSW supported patient in processing her grief over mother's changes and decline that will eventually increase due to her advanced age.  Throughout the session,  CSW gave patient the opportunity to explore thoughts and feelings associated with current life situations and past/present external stressors.   CSW encouraged patient's expression of feelings and validated patient's thoughts using empathy, active listening, open body language, and unconditional positive regard.       Recommendations:  Return to therapy in 2 weeks, engage in self care behaviors, continue to maintain boundaries with partner, enjoy friends, reach out to as many as she feels she can handle while traveling to their area   Plan: Return again in 2weeks  Next appointment:  7/3  Diagnosis:  Moderate episode of recurrent major depressive disorder (HCC)  Anxiety disorder, unspecified type  Relationship problem between partners   Collaboration of Care: Patient refused AEB - not interested in medicines right now although CSW did recommend seeing psychiatrist again    Patient/Guardian was advised Release of Information must be obtained prior to any record release in order to collaborate their care with an outside provider. Patient/Guardian was advised if they have not already done so to contact the registration department to sign all necessary forms in order for [place] to release information regarding their care.   Consent: Patient/Guardian gives verbal consent for treatment and assignment of benefits for services provided during this visit. Patient/Guardian expressed understanding and agreed to proceed.   Lynnell Chad, LCSW 02/25/2023

## 2023-03-02 ENCOUNTER — Ambulatory Visit (HOSPITAL_COMMUNITY): Payer: Medicare Other | Admitting: Clinical

## 2023-03-05 ENCOUNTER — Encounter (HOSPITAL_COMMUNITY): Payer: Self-pay

## 2023-03-09 ENCOUNTER — Ambulatory Visit (HOSPITAL_COMMUNITY): Payer: Medicare Other | Admitting: Clinical

## 2023-03-10 ENCOUNTER — Telehealth: Payer: Self-pay | Admitting: *Deleted

## 2023-03-10 ENCOUNTER — Inpatient Hospital Stay: Payer: Medicare Other

## 2023-03-10 NOTE — Telephone Encounter (Signed)
Patient called and moved her appt from today to 7/8

## 2023-03-11 ENCOUNTER — Encounter (HOSPITAL_COMMUNITY): Payer: Self-pay | Admitting: Clinical

## 2023-03-11 ENCOUNTER — Ambulatory Visit (INDEPENDENT_AMBULATORY_CARE_PROVIDER_SITE_OTHER): Payer: Medicare Other | Admitting: Clinical

## 2023-03-11 DIAGNOSIS — F331 Major depressive disorder, recurrent, moderate: Secondary | ICD-10-CM | POA: Diagnosis not present

## 2023-03-11 DIAGNOSIS — F419 Anxiety disorder, unspecified: Secondary | ICD-10-CM

## 2023-03-11 NOTE — Progress Notes (Signed)
THERAPIST PROGRESS NOTE  Session Time: 11:03am-12:03pm  Session #14  Participation Level: Active  Behavioral Response: Casual and Well Groomed Alert Euthymic  Type of Therapy: Individual Therapy  Treatment Goals addressed:  LTG: Melissa "[name]" will score less than 5 on the Generalized Anxiety Disorder 7 Scale (GAD-7)    STG: Melissa "[name]" will participate in at least 80% of scheduled individual psychotherapy sessions      ProgressTowards Goals: Progressing  Interventions: Strength-based and Supportive  Summary: Melissa Holmes is a 67 y.o. female who presents with increased anxiety since her partner fell and broke ribs, then went back to drinking after a particularly bad detox from alcohol. She processed the entirety of her trip to [place] to see friends, stating that she realized over the course of that trip that most of her friends are alcoholics and quite unhappy.  She also spent 3 days with her brother and his wife, described several incidents which enraged her brother, none at her specifically.  She stated she worked hard to "just roll with it" and not let it get her upset.  In fact, she laughed hard throughout the description of each of the events with his boats.  She also went to a party that was ostensibly a family reunion/50th anniversary for their cousin.  She noted how consumed people are with their lives and how she could not even get a ride.  She stated that most people she saw throughout the trip were overweight, talking about their various aches and pains, and eating poor diets.  She was able to comparatively appreciate her own physical and mental health.  Her partner remained drunk the whole time she was gone, she believes.  He called her at 3am several times until she got short with him.  He called out from work for most of the time too.  She stated she has not found the vodka bottles, but she feels certain he was drinking more than just wine.  She is totally  convinced and aware that his drinking is not her fault, and she intends to engage with him in a loving manner.   Suicidal/Homicidal: No  Therapist Response: Patient is progressing AEB engaging in scheduled therapy session.  She presented oriented x5 and stated she was feeling "tired" and has been sleeping most of the time since she returned from her trip  CSW supported patient in processing the trip and her feelings about her brother, friends, and partner.  Throughout the session, CSW gave patient the opportunity to explore thoughts and feelings associated with current life situations and past/present external stressors.   CSW encouraged patient's expression of feelings and validated patient's thoughts using empathy, active listening, open body language, and unconditional positive regard.     Recommendations:  Return to therapy in 1 week, engage in self care behaviors, continue to handle partner's alcoholic behavior with detachment  Plan: Return again in 1 week  Next appointment:  7/8  Diagnosis:  Moderate episode of recurrent major depressive disorder (HCC)  Anxiety disorder, unspecified type   Collaboration of Care: Patient refused AEB - not interested in medicines right now     Patient/Guardian was advised Release of Information must be obtained prior to any record release in order to collaborate their care with an outside provider. Patient/Guardian was advised if they have not already done so to contact the registration department to sign all necessary forms in order for [place] to release information regarding their care.   Consent: Patient/Guardian gives verbal  consent for treatment and assignment of benefits for services provided during this visit. Patient/Guardian expressed understanding and agreed to proceed.   Lynnell Chad, LCSW 03/11/2023

## 2023-03-16 ENCOUNTER — Encounter (HOSPITAL_COMMUNITY): Payer: Self-pay | Admitting: Clinical

## 2023-03-16 ENCOUNTER — Telehealth: Payer: Self-pay | Admitting: *Deleted

## 2023-03-16 ENCOUNTER — Other Ambulatory Visit: Payer: Self-pay

## 2023-03-16 ENCOUNTER — Inpatient Hospital Stay: Payer: Medicare Other | Attending: Gynecologic Oncology

## 2023-03-16 ENCOUNTER — Ambulatory Visit (INDEPENDENT_AMBULATORY_CARE_PROVIDER_SITE_OTHER): Payer: Medicare Other | Admitting: Clinical

## 2023-03-16 ENCOUNTER — Inpatient Hospital Stay: Payer: Medicare Other

## 2023-03-16 DIAGNOSIS — F419 Anxiety disorder, unspecified: Secondary | ICD-10-CM | POA: Diagnosis not present

## 2023-03-16 DIAGNOSIS — R3129 Other microscopic hematuria: Secondary | ICD-10-CM | POA: Diagnosis not present

## 2023-03-16 DIAGNOSIS — Z8543 Personal history of malignant neoplasm of ovary: Secondary | ICD-10-CM | POA: Diagnosis present

## 2023-03-16 DIAGNOSIS — C569 Malignant neoplasm of unspecified ovary: Secondary | ICD-10-CM

## 2023-03-16 DIAGNOSIS — Z630 Problems in relationship with spouse or partner: Secondary | ICD-10-CM | POA: Diagnosis not present

## 2023-03-16 DIAGNOSIS — F331 Major depressive disorder, recurrent, moderate: Secondary | ICD-10-CM

## 2023-03-16 LAB — URINALYSIS, COMPLETE (UACMP) WITH MICROSCOPIC
Bilirubin Urine: NEGATIVE
Glucose, UA: NEGATIVE mg/dL
Hgb urine dipstick: NEGATIVE
Ketones, ur: NEGATIVE mg/dL
Leukocytes,Ua: NEGATIVE
Nitrite: NEGATIVE
Protein, ur: NEGATIVE mg/dL
Specific Gravity, Urine: 1.003 — ABNORMAL LOW (ref 1.005–1.030)
pH: 6 (ref 5.0–8.0)

## 2023-03-16 NOTE — Telephone Encounter (Signed)
Patient was unable to be reached, voicemail left requesting a call back at [phone].

## 2023-03-16 NOTE — Progress Notes (Signed)
Patient came in to give urine sample x2. Second sample to be held in clinic and sent to cytology if UA came back showing blood in urine. Sample came back negative for blood in urine so second sample discarded appropriately. Patient discharged alert, oriented, and ambulatory.

## 2023-03-16 NOTE — Progress Notes (Signed)
THERAPIST PROGRESS NOTE  Session Time: 11:04am-12:02pm  Session #15  Participation Level: Active  Behavioral Response: Casual and Well Groomed Alert Depressed and Tearful  Type of Therapy: Individual Therapy  Treatment Goals addressed:  LTG: [name] will score less than 5 on the Generalized Anxiety Disorder 7 Scale (GAD-7)    STG: [name] will participate in at least 80% of scheduled individual psychotherapy sessions      ProgressTowards Goals: Progressing  Interventions: Strength-based, Supportive, and Other: substance abuse of loved one  Summary: Melissa Holmes is a 67 y.o. female who presents with increased anxiety since her partner fell and broke ribs, then went back to drinking after a particularly bad detox from alcohol.  Most of the session was spent talking about an intervention she did with her partner a few days ago.  He broke a rib while she was traveling in [place] and he was drinking heavily.  She is sure he has progressed to drinking hard liquor and told him so.  She came to the decision to confront him about her unwillingness to continue as is because she has joined 3 different [employer] groups and has continued to read about various people's approaches and experiences.  Although she has previously been unwilling, she now is considering attendance at an on-line [employer].  When she hears from her partner that he is "trying" she has gotten some good suggestions from her on-line supports.  She told him that the current situation has to change, was honest with him about how frightened she was in January when he was hospitalized (to which he said it might have been better if he had died then), and how he puts not just himself but the security for both of them in jeopardy when he drinks and drives for instance because they could lose everything.  She did not yet tell him but she is looking at places to move to.  She does not know if she wants to  move in with her mother temporarily to see what happens, but during the session she described yet more problematic interactions with her mother who does not respect her desire to NOT talk about her partner's alcohol problems with mother.    She spoke with her best friend recently who observed to her that therapy must be working for her, because she has not previously been so calm in talking about her partner's drinking and its impact on her.  She asked therapist, "Is that progress?"  CSW asked her opinion, and she said she does find herself wondering why she has to ask CSW for things she knows inside herself.  She was encouraged that this is progress in and of itself, to recognize that is what she does and to know she has the capability herself.   Suicidal/Homicidal: No  Therapist Response:  Patient is progressing AEB engaging in scheduled therapy session.  She presented oriented x5 and stated she was feeling "validated by [employer] things I've been reading."  CSW evaluated patient's self-care since last session.  She explained what she did with her partner in terms of telling him where she stands on his current drinking.  At one point she told him to "stop hiding" what he is doing, because she knows and he does not have to make it worse by being a liar with her.  We will continue to discuss her next steps at next session on 7/17, as she is starting to think about those  and consider options.  Throughout the session, CSW gave patient the opportunity to explore thoughts and feelings associated with current life situations and past/present external stressors.   CSW encouraged patient's expression of feelings and validated patient's thoughts using empathy, active listening, open body language, and unconditional positive regard.       Recommendations:  Return to therapy in 1 week, engage in self care behaviors, continue to handle partner's alcoholic behavior with detachment  Plan: Return again in 1  week  Next appointment:  7/17  Diagnosis:  Moderate episode of recurrent major depressive disorder (HCC)  Anxiety disorder, unspecified type  Relationship problem between partners   Collaboration of Care: Patient refused AEB - not interested in medicines right now     Patient/Guardian was advised Release of Information must be obtained prior to any record release in order to collaborate their care with an outside provider. Patient/Guardian was advised if they have not already done so to contact the registration department to sign all necessary forms in order for [place] to release information regarding their care.   Consent: Patient/Guardian gives verbal consent for treatment and assignment of benefits for services provided during this visit. Patient/Guardian expressed understanding and agreed to proceed.   Lynnell Chad, LCSW 03/16/2023

## 2023-03-16 NOTE — Patient Instructions (Signed)
Our office will call you with UA results.

## 2023-03-17 NOTE — Telephone Encounter (Signed)
-----   Message from Doylene Bode, NP sent at 03/16/2023  1:01 PM EDT ----- Could you please let her know no red blood cells were seen on this repeat sample so must have been from the exam at her last appt. How is she doing? Any persistent symptoms? ----- Message ----- From: Leory Plowman, [facility] [place] Sent: 03/16/2023  12:57 PM EDT To: Doylene Bode, NP

## 2023-03-17 NOTE — Telephone Encounter (Addendum)
Spoke with Melissa Holmes and relayed message from Warner Mccreedy, NP of her urinalysis results showed no red blood cells. Patient states her symptoms have lessened and she notices only have urgency at different times for example after being on a short car ride when she arrives at her destination she has to empty her bladder. Pt states while she is at work, she can go 5-6 hours without having to use the bathroom.   Patient denies burning and or pain while urinating. Advised patient to call the office back with any persistent and or worsening symptoms, questions or concerns. Patient verbalized understanding and thanked the office for calling.

## 2023-03-23 ENCOUNTER — Ambulatory Visit (HOSPITAL_COMMUNITY): Payer: Medicare Other | Admitting: Clinical

## 2023-03-25 ENCOUNTER — Encounter (HOSPITAL_COMMUNITY): Payer: Self-pay | Admitting: Clinical

## 2023-03-25 ENCOUNTER — Ambulatory Visit (HOSPITAL_COMMUNITY): Payer: Medicare Other | Admitting: Clinical

## 2023-03-25 DIAGNOSIS — F419 Anxiety disorder, unspecified: Secondary | ICD-10-CM | POA: Diagnosis not present

## 2023-03-25 DIAGNOSIS — F331 Major depressive disorder, recurrent, moderate: Secondary | ICD-10-CM | POA: Diagnosis not present

## 2023-03-25 DIAGNOSIS — Z630 Problems in relationship with spouse or partner: Secondary | ICD-10-CM | POA: Diagnosis not present

## 2023-03-25 NOTE — Progress Notes (Addendum)
THERAPIST PROGRESS NOTE  Session Time: 11:07am-12:07pm  Session #16  Participation Level: Active  Behavioral Response: Casual and Well Groomed Alert Euthymic  Type of Therapy: Individual Therapy  Treatment Goals addressed:  LTG: [name] will score less than 5 on the Generalized Anxiety Disorder 7 Scale (GAD-7)    STG: [name] will participate in at least 80% of scheduled individual psychotherapy sessions       New Treatment Plan:  Problem: Anger Management LTG: [name] will reduce the amount of anger-related incidents/outbursts by 50% as evidenced by self-report STG: [name] will identify situations, thoughts, and feelings that trigger internal anger, and/or angry/aggressive actions as evidenced by self-report Interventions:  Educate [name] on anger management skills and the rationale for learning these skills Educate [name] on internal and external reinforcers of anger response and the rationale for identifying reinforcers of angry responses Educate [name] on relaxation techniques and the rationale for learning these techniques Encourage [name] to practice relaxation skills at home for 10 minutes, 1 times per day, for 90 days Educate [employer]" on trigger thoughts (thoughts that trigger an anger response) Educate [name] on the 6 categories of cognitive distortions that promote anger (Blaming, Catastrophizing, Global Labeling, Misattributions, Overgeneralization, Demanding) Educate [name] on coping skills to cope with anger arousal Work with Kearney Hard "[name]" to develop a coping plan including situations, cues to cope, when to cope, and how to cope in individual session Encourage [name] to complete an anger log each week for the next 12 weeks     Problem: Anxiety STG: [name] will practice problem solving skills 3 times per week for the next 4  weeks. STG: [name] will reduce frequency of avoidant behaviors by 50% as evidenced by self-report in therapy sessions Interventions:  Work with patient individually to identify the major components of a recent episode of anxiety: physical symptoms, major thoughts and images, and major behaviors they experienced Work with Kearney Hard "[name]" to complete a TRAP analysis (trigger, response, avoidance pattern) of their avoidant behaviors. Work with Kearney Hard "[name]" to identify a minimum of 3 consequences of avoidance. Work with Kearney Hard "[name]" to identify a minimum of 3 alternative coping behaviors to avoidance.  Problem: OP Depression LTG: Increase coping skills to manage depression and improve ability to perform daily activities STG: [name] will identify cognitive patterns and beliefs that support depression Interventions:  [name] will identify 3-5 cognitive distortions they are currently using and write reframing statements to replace them Therapist will review PLEASE Skills (Treat Physical Illness, Balance Eating, Avoid Mood-Altering Substances, Balance Sleep and Get Exercise) with patient   ProgressTowards Goals: Progressing  Interventions: Strength-based, Supportive, and Other: substance abuse of loved one  Summary: Ericah Vaclavik is a 67 y.o. female who presents with increased anxiety since her partner fell and broke ribs, then went back to drinking after a particularly bad detox from alcohol.  She stated that after the "talk" she had with him just prior to last session, he has been trying, drinking less, and his behavior has not been a roller coaster.  She continues to not cook specifically for him, but will cook for herself and tell him there is extra if he wants it.  One day, he complained about something she was making and she was able to calmly tell him she was cooking it for herself, not him, and tht if he wanted something different, he could cook  it.  On Monday he got mad  at her about something, and she felt good about the way she handled it calmly without acquiescence.  She did cuss at him, he apologized and they moved on.  CSW introduced her to HALTS as an acronym to check on herself and when she might be more vulnerable to his alcohol-induced behaviors that bother her.  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  She tries to answer them calmly each time, but it can be difficult.  She touched briefly on some difficult interactions with mother, is applying the same types of boundaries with mother.  She still has not been to an [employer], is not sure she will go, but she continues to read group chats in various [employer] groups in [employer].  She talked about learning from a friend recently about 2 rational thoughts (leave or take hands off) and 1 irrational thought (thinking can change the alcoholic).  She wants to get the book "The Naked Mind."  She would also like to know more about the Medicare-accepting 90-day re[facility] in [place], which CSW will email to her.  She intends to leave some literature lying around in case he will read it.  CSW cautioned her about this backfiring.   Suicidal/Homicidal: No  Therapist Response: Patient is progressing AEB engaging in scheduled therapy session.  She presented oriented x5 and stated she was feeling "stable."  CSW evaluated patient's medication compliance and self-care since last session.  CSW reviewed the last session with patient who reported things have been much calmer in the last 10 days.  Patient stated her partner has not been drinking as much and has tried not to be confrontational with her, although he did several days ago lose his temper over her putting vegetable scraps into a container on the wrong side of the sink.  CSW taught abstinence-related information, specifically HALTS and PAWS.  Patient received these  willingly and could discuss them in a way to indicate good comprehension.  CSW assigned patient the task of trying out these new coping skills (HALTS) prior to next session on 7/22.    Throughout the session, CSW gave patient the opportunity to explore thoughts and feelings associated with current life situations and past/present external stressors.   CSW encouraged patient's expression of feelings and validated patient's thoughts using empathy, active listening, open body language, and unconditional positive regard.      Recommendations:  Return to therapy in 1 week, engage in self care behaviors, continue to handle partner's alcoholic behavior with detachment, try HALTS  Plan: Return again in 1 week  Next appointment:  7/22  Diagnosis:  Moderate episode of recurrent major depressive disorder (HCC)  Anxiety disorder, unspecified type  Relationship problem between partners   Collaboration of Care: Patient refused AEB - not interested in medicines right now     Patient/Guardian was advised Release of Information must be obtained prior to any record release in order to collaborate their care with an outside provider. Patient/Guardian was advised if they have not already done so to contact the registration department to sign all necessary forms in order for [place] to release information regarding their care.   Consent: Patient/Guardian gives verbal consent for treatment and assignment of benefits for services provided during this visit. Patient/Guardian expressed understanding and agreed to proceed.   Lynnell Chad, LCSW 03/25/2023

## 2023-03-27 ENCOUNTER — Encounter (HOSPITAL_COMMUNITY): Payer: Self-pay

## 2023-03-30 ENCOUNTER — Ambulatory Visit (INDEPENDENT_AMBULATORY_CARE_PROVIDER_SITE_OTHER): Payer: Medicare Other | Admitting: Clinical

## 2023-03-30 DIAGNOSIS — Z91199 Patient's noncompliance with other medical treatment and regimen due to unspecified reason: Secondary | ICD-10-CM

## 2023-03-30 NOTE — Progress Notes (Signed)
Therapy Progress Note  Patient had an in-person appointment scheduled with therapist on 03/30/2023  at 11:00am.  She still did not arrive for the session at 11:30am, so was considered a no show.  CSW called to check on her because she is always at her appointments.  She stated she had told the front desk some time ago to cancel all Monday appointments, so when she got a reminder, she assumed it was for Wednesday which is her usual time.  Encounter Diagnosis  Name Primary?   No-show for appointment Yes     Ambrose Mantle, LCSW 03/30/2023, 1:37 PM

## 2023-04-06 ENCOUNTER — Ambulatory Visit (HOSPITAL_COMMUNITY): Payer: Medicare Other | Admitting: Clinical

## 2023-04-08 ENCOUNTER — Encounter (HOSPITAL_COMMUNITY): Payer: Self-pay | Admitting: Clinical

## 2023-04-08 ENCOUNTER — Ambulatory Visit (INDEPENDENT_AMBULATORY_CARE_PROVIDER_SITE_OTHER): Payer: Medicare Other | Admitting: Clinical

## 2023-04-08 DIAGNOSIS — F331 Major depressive disorder, recurrent, moderate: Secondary | ICD-10-CM

## 2023-04-08 DIAGNOSIS — F419 Anxiety disorder, unspecified: Secondary | ICD-10-CM | POA: Diagnosis not present

## 2023-04-08 DIAGNOSIS — Z630 Problems in relationship with spouse or partner: Secondary | ICD-10-CM | POA: Diagnosis not present

## 2023-04-08 NOTE — Progress Notes (Signed)
THERAPIST PROGRESS NOTE  Session Time: 11:01am-12:01pm  Session #17  Participation Level: Active  Behavioral Response: Casual and Well Groomed Alert Euthymic  Type of Therapy: Individual Therapy  Treatment Goals addressed:  LTG: [name] will score less than 5 on the Generalized Anxiety Disorder 7 Scale (GAD-7)    STG: [name] will participate in at least 80% of scheduled individual psychotherapy sessions     LTG: [name] will reduce the amount of anger-related incidents/outbursts by 50% as evidenced by self-report STG: Caity "[name]" will identify situations, thoughts, and feelings that trigger internal anger, and/or angry/aggressive actions as evidenced by self-report STG: [name] will practice problem solving skills 3 times per week for the next 4 weeks. STG: [name] will reduce frequency of avoidant behaviors by 50% as evidenced by self-report in therapy session LTG: Increase coping skills to manage depression and improve ability to perform daily activities STG: [name] will identify cognitive patterns and beliefs that support depression  ProgressTowards Goals: Progressing  Interventions: Assertiveness Training, Supportive, and Anger Management Training  Summary: Giahna Dina is a 67 y.o. female who presents with increased anxiety since her partner fell and broke ribs, then went back to drinking after a particularly bad detox from alcohol.  The session was spent processing various things happening with her alcoholic partner, how she felt, and how she responded.  She stated initially that she "had to listen to" a great deal of negativity as her partner talked about going back to work once his rib had healed sufficiently.  She did not respond to the negativity, however, and was proud of this.  When CSW pointed out that she does not actually have to listen to him, we had to process what she can do instead.  She was  very open to this.  She reported that she tries to prevent herself from noticing if wine bottles are emptying, because it is nothing for her to change.  He recently asked her for "permission" to buy some alcohol, to which she told him to do what he wanted but she does not want it consumed in front of her.  He came home with 4 bottles of wine.  She reported that she is pretty sure when he becomes really negative that he has had at least one shot.  We reviewed again detached statements she can make to let him know it is not okay for him to act in certain ways around her.    We also processed some recent developments with her mother, who has said repeatedly she does not have to come visit every Sunday, has said it has become pretty boring.  She recently asked her mother questions about her childhood and early courting with patient's father.  Mother ended up talking for over 2 hours about this.  Patient is becoming calmer and more insightful.  She notices this herself and feels good about it.   Suicidal/Homicidal: No  Therapist Response: Patient is progressing AEB engaging in scheduled therapy session.  She presented oriented x5 and stated she was feeling "disappointed and withdrawn."  CSW reviewed the last session with patient who reported she continues to benefit from reading a lot from [employer] participants.  Patient stated she has found out not only that she is not the only person suffering with a partner who is an alcoholic, but there are actually thousands and possibly hundreds of thousands.  It is evidenced in the way she talks differently and is calmer.  Throughout  the session, CSW gave patient the opportunity to explore thoughts and feelings associated with current life situations and past/present external stressors.   CSW encouraged patient's expression of feelings and validated patient's thoughts using empathy, active listening, open body language, and unconditional positive regard.         Recommendations:  Return to therapy in 1 week, engage in self care behaviors, continue to handle partner's alcoholic behavior with detachment  Plan: Return again in 1 week  Next appointment:  8/5  Diagnosis:  Moderate episode of recurrent major depressive disorder (HCC)  Anxiety disorder, unspecified type  Relationship problem between partners   Collaboration of Care: Patient refused AEB - not interested in medicines right now     Patient/Guardian was advised Release of Information must be obtained prior to any record release in order to collaborate their care with an outside provider. Patient/Guardian was advised if they have not already done so to contact the registration department to sign all necessary forms in order for [place] to release information regarding their care.   Consent: Patient/Guardian gives verbal consent for treatment and assignment of benefits for services provided during this visit. Patient/Guardian expressed understanding and agreed to proceed.   Lynnell Chad, LCSW 04/08/2023

## 2023-04-13 ENCOUNTER — Ambulatory Visit (HOSPITAL_COMMUNITY): Payer: Medicare Other | Admitting: Clinical

## 2023-04-13 ENCOUNTER — Encounter (HOSPITAL_COMMUNITY): Payer: Self-pay | Admitting: Clinical

## 2023-04-13 DIAGNOSIS — F331 Major depressive disorder, recurrent, moderate: Secondary | ICD-10-CM

## 2023-04-13 DIAGNOSIS — F419 Anxiety disorder, unspecified: Secondary | ICD-10-CM | POA: Diagnosis not present

## 2023-04-13 DIAGNOSIS — Z630 Problems in relationship with spouse or partner: Secondary | ICD-10-CM

## 2023-04-13 NOTE — Progress Notes (Unsigned)
THERAPIST PROGRESS NOTE  Session Time: 11:02am-12:01pm  Session #18  Participation Level: Active  Behavioral Response: Casual and Well Groomed Alert Euthymic  Type of Therapy: Individual Therapy  Treatment Goals addressed:  LTG: [name] will score less than 5 on the Generalized Anxiety Disorder 7 Scale (GAD-7)    STG: [name] will participate in at least 80% of scheduled individual psychotherapy sessions     LTG: [name] will reduce the amount of anger-related incidents/outbursts by 50% as evidenced by self-report STG: Shakelia "[name]" will identify situations, thoughts, and feelings that trigger internal anger, and/or angry/aggressive actions as evidenced by self-report STG: [name] will practice problem solving skills 3 times per week for the next 4 weeks. STG: [name] will reduce frequency of avoidant behaviors by 50% as evidenced by self-report in therapy session LTG: Increase coping skills to manage depression and improve ability to perform daily activities STG: [name] will identify cognitive patterns and beliefs that support depression  ProgressTowards Goals: Progressing  Interventions: Assertiveness Training and Supportive  Summary: Tish Etheredge is a 67 y.o. female who presents with increased anxiety since her partner fell and broke ribs, then went back to drinking after a particularly bad detox from alcohol.  With many smiles, she stated she is "trying to keep myself together but I keep hitting brick walls."  These were discussed throughout the session.  She reported she is going on a trip to [place] soon and is hopeful that she will not receive a call from her partner that he has broken another rib during a drunken episode this time.  She shared that her partner's doctor has put him on Alprazolam which she believes contributed to him being able to drink only 2 bottles of wine over the course of 2 weeks.  CSW  provided psychoeducation re benzodiazepines as a class of medicine.  She does suspect he continues to get hard liquor somewhere as well.  She is employing detachment as much as possible.  We processed some ways in which she can approach her concerns with him without either insulting him or being co-dependent.  She did use CSW's recommendation to compliment him when he does positive things, so she employed this with a good result recently.  She has used her "I" statements and communication skills to talk with manager about her schedule being more regular, albeit with little result to date.  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    We also processed how her relationship with mother is becoming a little easier since she knows to have a boundary about discussing politics, knows she does not have to go over every Sunday afternoon, and knows more about detachment.  She stated her mother had to answer a survey while patient was present recently, so she overheard her mother say that her goal is to live to be 68yo.  Although patient has previously had significant anxiety about the thought of her mother dying, she indicated this was not a goal she welcomed.  This was processed as well.   Suicidal/Homicidal: No  Therapist Response: Patient is progressing AEB engaging in scheduled therapy session.  She presented oriented x5 and stated she was feeling "good."  CSW evaluated patient's medication compliance and self-care since last session.  CSW reviewed the last session with patient who reported she did use positive feedback to her partner  to improve their relationship and this seems to be working.  Patient stated she might leave her job because they are not honoring her requests for scheduling.  CSW supported her and made additional suggestions re communication as appropriate throughout the  session.  Throughout the session, CSW gave patient the opportunity to explore thoughts and feelings associated with current life situations and past/present external stressors.   CSW encouraged patient's expression of feelings and validated patient's thoughts using empathy, active listening, open body language, and unconditional positive regard.            Recommendations:  Return to therapy in 2 weeks, engage in self care behaviors, continue to handle partner's alcoholic behavior with detachment, continue to thank husband as appropriate, continue to be assertive with manager at work  Plan: Return again in 2 weeks  Next appointment:  8/14  Diagnosis:  Moderate episode of recurrent major depressive disorder (HCC)  Anxiety disorder, unspecified type  Relationship problem between partners   Collaboration of Care: Patient refused AEB - not interested in medicines right now     Patient/Guardian was advised Release of Information must be obtained prior to any record release in order to collaborate their care with an outside provider. Patient/Guardian was advised if they have not already done so to contact the registration department to sign all necessary forms in order for [place] to release information regarding their care.   Consent: Patient/Guardian gives verbal consent for treatment and assignment of benefits for services provided during this visit. Patient/Guardian expressed understanding and agreed to proceed.   Lynnell Chad, LCSW 04/13/2023

## 2023-04-22 ENCOUNTER — Ambulatory Visit (HOSPITAL_COMMUNITY): Payer: Medicare Other | Admitting: Clinical

## 2023-04-29 ENCOUNTER — Ambulatory Visit (HOSPITAL_COMMUNITY): Payer: Medicare Other | Admitting: Clinical

## 2023-05-06 ENCOUNTER — Ambulatory Visit (INDEPENDENT_AMBULATORY_CARE_PROVIDER_SITE_OTHER): Payer: Medicare Other | Admitting: Clinical

## 2023-05-06 ENCOUNTER — Encounter (HOSPITAL_COMMUNITY): Payer: Self-pay | Admitting: Clinical

## 2023-05-06 DIAGNOSIS — F419 Anxiety disorder, unspecified: Secondary | ICD-10-CM

## 2023-05-06 DIAGNOSIS — F331 Major depressive disorder, recurrent, moderate: Secondary | ICD-10-CM

## 2023-05-06 NOTE — Progress Notes (Unsigned)
THERAPIST PROGRESS NOTE  Session Time: 11:03am-12:01pm  Session #19  Participation Level: Active  Behavioral Response: Casual and Well Groomed Alert Euthymic  Type of Therapy: Individual Therapy  Treatment Goals addressed:  LTG: Melissa "[name]" will score less than 5 on the Generalized Anxiety Disorder 7 Scale (GAD-7)    STG: Melissa "[name]" will participate in at least 80% of scheduled individual psychotherapy sessions     LTG: Melissa "[name]" will reduce the amount of anger-related incidents/outbursts by 50% as evidenced by self-report STG: Melissa "[name]" will identify situations, thoughts, and feelings that trigger internal anger, and/or angry/aggressive actions as evidenced by self-report STG: Melissa "[name]" will practice problem solving skills 3 times per week for the next 4 weeks. STG: Melissa "[name]" will reduce frequency of avoidant behaviors by 50% as evidenced by self-report in therapy session LTG: Increase coping skills to manage depression and improve ability to perform daily activities STG: Melissa "[name]" will identify cognitive patterns and beliefs that support depression  ProgressTowards Goals: Progressing  Interventions: Assertiveness Training and Supportive  Summary: Melissa Holmes is a 67 y.o. female who presents with increased anxiety and anger with her partner who is an alcoholic, wanting to learn how to better manage her emotions and thoughts.  Her session was spent processing how she has handled various issues that have arisen since last session, mostly with either partner or mother, but also with different family members on a trip to [place].  She feels better about how she is handling all of these people/situations, states she is no longer running to her balcony to smoke a cigarette.  She no longer tries to control her partner's drinking, only herself, her reactions to him, and her willingness to spend time around him.  She talked about recently  having COVID and feeling "guilty" for calling out from work.  That this type of feeling comes from "shoulding" was discussed.  She was provided with positive feedback about how she is calmer as she chooses to handle things in a more detached fashion, Al Anon-style.  She relishes the praise and CSW ensured that she is providing herself just as much praise to stoke her own self-esteem.  There is a significant problem between her mother and her mother's neighbor.  She was given ideas of how to handle the situation, and seemed content to mostly be her mother's sounding board and allow her mother to handle it.  Her mother will soon be 95yo and the patient will pick up where she feels it is needed.  She was empathetic to her mother's hurt over a recent letter from the neighbor being addressed to both her and patient's deceased father.  She was also empathetic to the need that her mother has to do as much as possible independently.  She is no longer hurt by her mother's boredom with their Sunday afternoon visits, but has converted this thinking to be acceptance and gratitude for the honesty.   Suicidal/Homicidal: No  Therapist Response:   Patient is progressing AEB engaging in scheduled therapy session.  She presented oriented x5 and shared about her recent trip to [place] to see family.  Patient stated that even though there was some standoffish behavior initially from cousins, they are now planning to stay in touch.  Patient showed growth in insight particularly about how she handles both her partner and her mother.  She is both more detached and more accepting.  Throughout the session, CSW gave patient the opportunity to explore thoughts and feelings associated  with current life situations and past/present external stressors.   CSW encouraged patient's expression of feelings and validated patient's thoughts using empathy, active listening, open body language, and unconditional positive regard.        Recommendations:  Return to therapy in 1 week, engage in self care behaviors, continue to handle partner and mother with detachment as necessary  Plan: Return again in 1 week  Next appointment:  9/4  Diagnosis:  Moderate episode of recurrent major depressive disorder (HCC)  Anxiety disorder, unspecified type   Collaboration of Care: Patient refused AEB - not interested in medicines     Patient/Guardian was advised Release of Information must be obtained prior to any record release in order to collaborate their care with an outside provider. Patient/Guardian was advised if they have not already done so to contact the registration department to sign all necessary forms in order for [place] to release information regarding their care.   Consent: Patient/Guardian gives verbal consent for treatment and assignment of benefits for services provided during this visit. Patient/Guardian expressed understanding and agreed to proceed.   Lynnell Chad, LCSW 05/06/2023

## 2023-05-08 ENCOUNTER — Telehealth: Payer: Self-pay | Admitting: Adult Health

## 2023-05-08 DIAGNOSIS — F419 Anxiety disorder, unspecified: Secondary | ICD-10-CM

## 2023-05-08 MED ORDER — ALPRAZOLAM 1 MG PO TABS: ORAL_TABLET | ORAL | 2 refills | Status: DC

## 2023-05-08 NOTE — Addendum Note (Signed)
Addended by: Nancy Fetter on: 05/08/2023 03:34 PM   Modules accepted: Orders

## 2023-05-08 NOTE — Telephone Encounter (Signed)
Prescription Request  05/08/2023  LOV: 07/17/2022  What is the name of the medication or equipment? ALPRAZolam Prudy Feeler) 1 MG tablet   Have you contacted your pharmacy to request a refill? Yes   Which pharmacy would you like this sent to?  [id number] [place], [place] - [address] AT [facility] [address] & [facility] [address] [place] [address] Phone: [phone] Fax: [phone]    Patient notified that their request is being sent to the clinical staff for review and that they should receive a response within 2 business days.   Please advise at Mobile [phone] (mobile)

## 2023-05-12 ENCOUNTER — Other Ambulatory Visit: Payer: Medicare Other

## 2023-05-13 ENCOUNTER — Ambulatory Visit (INDEPENDENT_AMBULATORY_CARE_PROVIDER_SITE_OTHER): Payer: Medicare Other | Admitting: Clinical

## 2023-05-13 ENCOUNTER — Encounter (HOSPITAL_COMMUNITY): Payer: Self-pay | Admitting: Clinical

## 2023-05-13 DIAGNOSIS — F419 Anxiety disorder, unspecified: Secondary | ICD-10-CM

## 2023-05-13 DIAGNOSIS — F331 Major depressive disorder, recurrent, moderate: Secondary | ICD-10-CM | POA: Diagnosis not present

## 2023-05-13 DIAGNOSIS — Z630 Problems in relationship with spouse or partner: Secondary | ICD-10-CM
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
## 2023-05-20 ENCOUNTER — Encounter (HOSPITAL_COMMUNITY): Payer: Self-pay | Admitting: Clinical

## 2023-05-20 ENCOUNTER — Encounter: Payer: Self-pay | Admitting: Chiropractic Medicine

## 2023-05-20 ENCOUNTER — Ambulatory Visit (INDEPENDENT_AMBULATORY_CARE_PROVIDER_SITE_OTHER): Payer: Medicare Other | Admitting: Clinical

## 2023-05-20 DIAGNOSIS — F331 Major depressive disorder, recurrent, moderate: Secondary | ICD-10-CM | POA: Diagnosis not present

## 2023-05-20 DIAGNOSIS — F419 Anxiety disorder, unspecified: Secondary | ICD-10-CM
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
## 2023-05-27 ENCOUNTER — Encounter (HOSPITAL_COMMUNITY): Payer: Self-pay | Admitting: Clinical

## 2023-05-27 ENCOUNTER — Ambulatory Visit (INDEPENDENT_AMBULATORY_CARE_PROVIDER_SITE_OTHER): Payer: Medicare Other | Admitting: Clinical

## 2023-05-27 DIAGNOSIS — F419 Anxiety disorder, unspecified: Secondary | ICD-10-CM

## 2023-05-27 DIAGNOSIS — T7431XD Adult psychological abuse, confirmed, subsequent encounter: Secondary | ICD-10-CM

## 2023-05-27 DIAGNOSIS — F331 Major depressive disorder, recurrent, moderate: Secondary | ICD-10-CM
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
## 2023-06-03 ENCOUNTER — Ambulatory Visit (INDEPENDENT_AMBULATORY_CARE_PROVIDER_SITE_OTHER): Payer: Medicare Other | Admitting: Clinical

## 2023-06-03 ENCOUNTER — Encounter (HOSPITAL_COMMUNITY): Payer: Self-pay | Admitting: Clinical

## 2023-06-03 DIAGNOSIS — T7431XD Adult psychological abuse, confirmed, subsequent encounter: Secondary | ICD-10-CM | POA: Diagnosis not present

## 2023-06-03 DIAGNOSIS — F331 Major depressive disorder, recurrent, moderate: Secondary | ICD-10-CM | POA: Diagnosis not present

## 2023-06-03 DIAGNOSIS — F419 Anxiety disorder, unspecified: Secondary | ICD-10-CM
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
## 2023-06-06 ENCOUNTER — Encounter (HOSPITAL_COMMUNITY): Payer: Self-pay
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
## 2023-06-18 ENCOUNTER — Ambulatory Visit: Payer: Medicare Other | Admitting: Family Medicine

## 2023-06-18 DIAGNOSIS — Z0000 Encounter for general adult medical examination without abnormal findings: Secondary | ICD-10-CM

## 2023-06-18 NOTE — Patient Instructions (Addendum)
I really enjoyed getting to talk with you today! I am available on Tuesdays and Thursdays for virtual visits if you have any questions or concerns, or if I can be of any further assistance.   CHECKLIST FROM ANNUAL WELLNESS VISIT:  -Follow up (please call to schedule if not scheduled after visit):   -yearly for annual wellness visit with primary care office  Here is a list of your preventive care/health maintenance measures and the plan for each if any are due:  PLAN For any measures below that may be due:  -can get vaccines at pharmacy - please let us know if you do so that we can update your chart, can get the flu shot at the office  Health Maintenance  Topic Date Due   DTaP/Tdap/Td (1 - Tdap) Never done   Zoster Vaccines- Shingrix (1 of 2) Never done   COVID-19 Vaccine (3 - Moderna risk series) 01/10/2020   INFLUENZA VACCINE  04/09/2023   Medicare Annual Wellness (AWV)  06/17/2024   MAMMOGRAM  12/30/2024   Colonoscopy  09/09/2027   Pneumonia Vaccine 5+ Years old  Completed   DEXA SCAN  Completed   Hepatitis C Screening  Completed   HPV VACCINES  Aged Out    -See a dentist at least yearly  -Get your eyes checked and then per your eye specialist's recommendations  -Other issues addressed today:  -advise to quit smoking, 1-800-QUIT NOW offers free counseling.    -I have included below further information regarding a healthy whole foods based diet, physical activity guidelines for adults, stress management and opportunities for social connections. I hope you find this information useful.   -----------------------------------------------------------------------------------------------------------------------------------------------------------------------------------------------------------------------------------------------------------  NUTRITION: -eat real food: lots of colorful vegetables (half the plate) and fruits -5-7 servings of vegetables and fruits per day (fresh or  steamed is best), exp. 2 servings of vegetables with lunch and dinner and 2 servings of fruit per day. Berries and greens such as kale and collards are great choices.  -consume on a regular basis: whole grains (make sure first ingredient on label contains the word "whole"), fresh fruits, fish, nuts, seeds, healthy oils (such as olive oil, avocado oil, grape seed oil) -may eat small amounts of dairy and lean meat on occasion, but avoid processed meats such as ham, bacon, lunch meat, etc. -drink water -try to avoid fast food and pre-packaged foods, processed meat -most experts advise limiting sodium to < 2300mg  per day, should limit further is any chronic conditions such as high blood pressure, heart disease, diabetes, etc. The American Heart Association advised that < 1500mg  is is ideal -try to avoid foods that contain any ingredients with names you do not recognize  -try to avoid sugar/sweets (except for the natural sugar that occurs in fresh fruit) -try to avoid sweet drinks -try to avoid white rice, white bread, pasta (unless whole grain), white or yellow potatoes  EXERCISE GUIDELINES FOR ADULTS: -if you wish to increase your physical activity, do so gradually and with the approval of your doctor -STOP and seek medical care immediately if you have any chest pain, chest discomfort or trouble breathing when starting or increasing exercise  -move and stretch your body, legs, feet and arms when sitting for long periods -Physical activity guidelines for optimal health in adults: -least 150 minutes per week of aerobic exercise (can talk, but not sing) once approved by your doctor, 20-30 minutes of sustained activity or two 10 minute episodes of sustained activity every day.  -resistance training at  least 2 days per week if approved by your doctor -balance exercises 3+ days per week:   Stand somewhere where you have something sturdy to hold onto if you lose balance.    1) lift up on toes, start with  5x per day and work up to 20x   2) stand and lift on leg straight out to the side so that foot is a few inches of the floor, start with 5x each side and work up to 20x each side   3) stand on one foot, start with 5 seconds each side and work up to 20 seconds on each side  If you need ideas or help with getting more active:  -Silver sneakers https://tools.silversneakers.com  -Walk with a Doc: http://www.duncan-williams.com/  -try to include resistance (weight lifting/strength building) and balance exercises twice per week: or the following link for ideas: http://castillo-powell.com/  BuyDucts.dk  STRESS MANAGEMENT: -can try meditating, or just sitting quietly with deep breathing while intentionally relaxing all parts of your body for 5 minutes daily -if you need further help with stress, anxiety or depression please follow up with your primary doctor or contact the wonderful folks at [facility]: [phone]  SOCIAL CONNECTIONS: -options in [place] if you wish to engage in more social and exercise related activities:  -Silver sneakers https://tools.silversneakers.com  -Walk with a Doc: http://www.duncan-williams.com/  -Check out the [facility] Active Adults 50+ section on the [place] [employer] (hiking clubs, book clubs, cards and games, chess, exercise classes, aquatic classes and much more) - see the website for details: https://www.[place].gov/departments/parks-recreation/active-adults50  -YouTube has lots of exercise videos for different ages and abilities as well  -[facility] (a variety of indoor and outdoor inperson activities for adults). [phone]. [address]  -Virtual Online Classes (a variety of topics): see seniorplanet.org or call [phone]  -consider volunteering at a school, hospice center, church, senior center or  elsewhere    ADVANCED HEALTHCARE DIRECTIVES:  [place] Advanced Directives assistance:   ExpressWeek.com.cy  Everyone should have advanced health care directives in place. This is so that you get the care you want, should you ever be in a situation where you are unable to make your own medical decisions.   From the [place] Advanced Directive Website: "Advance Health Care Directives are legal documents in which you give written instructions about your health care if, in the future, you cannot speak for yourself.   A health care power of attorney allows you to name a person you trust to make your health care decisions if you cannot make them yourself. A declaration of a desire for a natural death (or living will) is document, which states that you desire not to have your life prolonged by extraordinary measures if you have a terminal or incurable illness or if you are in a vegetative state. An advance instruction for mental health treatment makes a declaration of instructions, information and preferences regarding your mental health treatment. It also states that you are aware that the advance instruction authorizes a mental health treatment provider to act according to your wishes. It may also outline your consent or refusal of mental health treatment. A declaration of an anatomical gift allows anyone over the age of 83 to make a gift by will, organ donor card or other document."   Please see the following website or an elder law attorney for forms, FAQs and for completion of advanced directives: [place] TEFL teacher Health Care Directives Advance Health Care Directives (  http://guzman.com/)  Or copy and paste the following to your web browser: PoshChat.fi

## 2023-06-18 NOTE — Progress Notes (Signed)
PATIENT CHECK-IN and HEALTH RISK ASSESSMENT QUESTIONNAIRE:  -completed by phone/video for upcoming Medicare Preventive Visit  Pre-Visit Check-in: 1)Vitals (height, wt, BP, etc) - record in vitals section for visit on day of visit Request home vitals (wt, BP, etc.) and enter into vitals, THEN update Vital Signs SmartPhrase below at the top of the HPI. See below.  2)Review and Update Medications, Allergies PMH, Surgeries, Social history in Epic 3)Hospitalizations in the last year with date/reason?  none  4)Review and Update Care Team (patient's specialists) in Epic 5) Complete PHQ9 in Epic  6) Complete Fall Screening in Epic 7)Review all Health Maintenance Due and order under PCP if not done.  8)Medicare Wellness Questionnaire: Answer theses question about your habits: Do you drink alcohol?  no Have you ever smoked? 1-2 cigs per day How many packs a day do/did you smoke?  1-2 cigs per day, lives with a smoker - hard to break because of this Do you use smokeless tobacco?no Do you use an illicit drugs?no Do you exercises? Not as much regular exercise recently, used to do yoga, dance, aerobics and barre - trying to find time and the right type of exercise - she likes to move though and does balance exercises every other day, stretching, leg lifts, planks  Reports diet is good, eats regular meals and trying to promote good gut health, eats whole grains, fresh fruit, veggies, is trying to avoid processed foods and sweets, quinoa, cous cous  Answer theses question about you: Can you perform most household chores? yes Do you find it hard to follow a conversation in a noisy room?no Do you often ask people to speak up or repeat themselves?no Do you feel that you have a problem with memory?no Do you balance your checkbook and or bank acounts?yes Do you feel safe at home? yes Last dentist visit? Goes on a regular basis Do you need assistance with any of the following: Please note if so  -  none  Driving?  Feeding yourself?  Getting from bed to chair?  Getting to the toilet?  Bathing or showering?  Dressing yourself?  Managing money?  Climbing a flight of stairs  Preparing meals?  Do you have Advanced Directives in place (Living Will, Healthcare Power or Attorney)?  no   Eye Exams?goes on a regular basis   Do you currently use prescribed or non-prescribed narcotic or opioid pain medications?no  Do you have a history or close family history of breast, ovarian, tubal or peritoneal cancer or a family member with BRCA (breast cancer susceptibility 1 and 2) gene mutations?   ----------------------------------------------------------------------------------------------------------------------------------------------------------------------------------------------------------------------  Because this visit was a virtual/telehealth visit, some criteria may be missing or patient reported. Any vitals not documented were not able to be obtained and vitals that have been documented are patient reported.    MEDICARE ANNUAL PREVENTIVE VISIT WITH PROVIDER: (Welcome to Medicare, initial annual wellness or annual wellness exam)  Virtual Visit via Video Note  I connected with Melissa Holmes on 06/18/23 by a video enabled telemedicine application and verified that I am speaking with the correct person using two identifiers.  Location patient: home Location provider:work or home office Persons participating in the virtual visit: patient, provider  Concerns and/or follow up today: no concerns.    See HM section in Epic for other details of completed HM.    ROS: negative for report of fevers, unintentional weight loss, vision changes, vision loss, hearing loss or change, chest pain, sob, hemoptysis, melena, hematochezia, hematuria, falls, bleeding  or bruising, thoughts of suicide or self harm, memory loss  Patient-completed extensive health risk assessment - reviewed and  discussed with the patient: See Health Risk Assessment completed with patient prior to the visit either above or in recent phone note. This was reviewed in detailed with the patient today and appropriate recommendations, orders and referrals were placed as needed per Summary below and patient instructions.   Review of Medical History: -PMH, PSH, Family History and current specialty and care providers reviewed and updated and listed below   Patient Care Team: Shirline Frees, NP as PCP - General (Family Medicine)   Past Medical History:  Diagnosis Date   Allergy    Colon polyps    history of precancerous   History of chicken pox    Ovarian cancer (HCC)    diagnosed 2 years ago-treated by a physician in [place]    Past Surgical History:  Procedure Laterality Date   ABDOMINAL HYSTERECTOMY  2019   APPENDECTOMY  2019   BREAST BIOPSY Right 2019   benign per patient   TONSILLECTOMY  1965    Social History   Socioeconomic History   Marital status: Married    Spouse name: Not on file   Number of children: Not on file   Years of education: Not on file   Highest education level: Not on file  Occupational History   Not on file  Tobacco Use   Smoking status: Some Days    Types: Cigarettes   Smokeless tobacco: Never   Tobacco comments:    2 cigarettes daily  Vaping Use   Vaping status: Never Used  Substance and Sexual Activity   Alcohol use: Yes    Comment: Occ wine   Drug use: Not Currently   Sexual activity: Not Currently  Other Topics Concern   Not on file  Social History Narrative   Not on file   Social Determinants of Health   Financial Resource Strain: Low Risk  (06/11/2022)   Overall Financial Resource Strain (CARDIA)    Difficulty of Paying Living Expenses: Not hard at all  Food Insecurity: No Food Insecurity (06/11/2022)   Hunger Vital Sign    Worried About Running Out of Food in the Last Year: Never true    Ran Out of Food in the Last Year: Never true   Transportation Needs: No Transportation Needs (06/11/2022)   PRAPARE - Administrator, Civil Service (Medical): No    Lack of Transportation (Non-Medical): No  Physical Activity: Sufficiently Active (06/11/2022)   Exercise Vital Sign    Days of Exercise per Week: 5 days    Minutes of Exercise per Session: 30 min  Stress: No Stress Concern Present (06/11/2022)   [employer] of Occupational Health - Occupational Stress Questionnaire    Feeling of Stress : Not at all  Social Connections: Moderately Isolated (06/11/2022)   Social Connection and Isolation Panel [NHANES]    Frequency of Communication with Friends and Family: More than three times a week    Frequency of Social Gatherings with Friends and Family: More than three times a week    Attends Religious Services: Never    Database administrator or Organizations: No    Attends Banker Meetings: Never    Marital Status: Married  Catering manager Violence: Not At Risk (06/11/2022)   Humiliation, Afraid, Rape, and Kick questionnaire    Fear of Current or Ex-Partner: No    Emotionally Abused: No    Physically  Abused: No    Sexually Abused: No    Family History  Problem Relation Age of Onset   Arthritis Mother    Diabetes Mother    Hearing loss Mother    Hypertension Mother    Hyperlipidemia Mother    Depression Father    Heart disease Father    Heart attack Father    Multiple myeloma Brother    Early death Brother    Hearing loss Maternal Grandmother    Alcohol abuse Maternal Grandfather    Cancer Maternal Grandfather    Cancer Paternal Grandmother    Early death Paternal Grandmother    Miscarriages / Stillbirths Paternal Grandmother    Diabetes Paternal Grandfather    Colon cancer Neg Hx    Breast cancer Neg Hx    Ovarian cancer Neg Hx    Endometrial cancer Neg Hx    Pancreatic cancer Neg Hx    Prostate cancer Neg Hx     Current Outpatient Medications on File Prior to Visit  Medication  Sig Dispense Refill   ALPRAZolam (XANAX) 1 MG tablet TAKE 1 TABLET(1 MG) BY MOUTH DAILY AS NEEDED FOR ANXIETY 30 tablet 2   ASCORBIC ACID PO Take by mouth daily in the afternoon.     Cholecalciferol (D3 VITAMIN PO) Take by mouth daily in the afternoon.     Lactobacillus (PROBIOTIC ACIDOPHILUS PO) Take by mouth daily in the afternoon.     Lysine 1000 MG TABS Take by mouth as needed.     No current facility-administered medications on file prior to visit.    No Known Allergies     Physical Exam Vitals requested from patient and listed below if patient had equipment and was able to obtain at home for this virtual visit: There were no vitals filed for this visit. Estimated body mass index is 18.54 kg/m as calculated from the following:   Height as of 02/18/23: 5\' 4"  (1.626 m).   Weight as of 02/18/23: 108 lb (49 kg).  EKG (optional): deferred due to virtual visit  GENERAL: alert, oriented, no acute distress detected, full vision exam deferred due to pandemic and/or virtual encounter  HEENT: atraumatic, conjunttiva clear, no obvious abnormalities on inspection of external nose and ears  NECK: normal movements of the head and neck  LUNGS: on inspection no signs of respiratory distress, breathing rate appears normal, no obvious gross SOB, gasping or wheezing  CV: no obvious cyanosis  MS: moves all visible extremities without noticeable abnormality  PSYCH/NEURO: pleasant and cooperative, no obvious depression or anxiety, speech and thought processing grossly intact, Cognitive function grossly intact        06/18/2023   10:01 AM 10/31/2022    3:16 PM 06/11/2022    9:39 AM 06/14/2021    8:29 AM 06/12/2021    4:18 PM  Depression screen PHQ 2/9  Decreased Interest 0  0 0 0  Down, Depressed, Hopeless 0  0 0 0  PHQ - 2 Score 0  0 0 0  Altered sleeping       Tired, decreased energy       Change in appetite       Feeling bad or failure about yourself        Trouble concentrating        Moving slowly or fidgety/restless       Suicidal thoughts       PHQ-9 Score       Difficult doing work/chores  Information is confidential and restricted. Go to Review Flowsheets to unlock data.  Seeing therapist and it has really helped.      02/05/2022   12:33 PM 06/11/2022    9:41 AM 08/19/2022    1:46 PM 06/18/2023    8:50 AM 06/18/2023   10:00 AM  Fall Risk  Falls in the past year?  0  0 0  Was there an injury with Fall?  0  0 0  Fall Risk Category Calculator  0  0 0  Fall Risk Category (Retired)  Low     (RETIRED) Patient Fall Risk Level Low fall risk Low fall risk Low fall risk    Patient at Risk for Falls Due to  No Fall Risks     Fall risk Follow up  Falls prevention discussed        SUMMARY AND PLAN:  Encounter for Medicare annual wellness exam    Discussed applicable health maintenance/preventive health measures and advised and referred or ordered per patient preferences: -she plans to get covid vaccine at pharmacy -plans to get flu shot at office -discussed other vaccines   Health Maintenance  Topic Date Due   DTaP/Tdap/Td (1 - Tdap) Never done   Zoster Vaccines- Shingrix (1 of 2) Never done   COVID-19 Vaccine (3 - Moderna risk series) 01/10/2020   INFLUENZA VACCINE  04/09/2023   Medicare Annual Wellness (AWV)  06/17/2024   MAMMOGRAM  12/30/2024   Colonoscopy  09/09/2027   Pneumonia Vaccine 84+ Years old  Completed   DEXA SCAN  Completed   Hepatitis C Screening  Completed   HPV VACCINES  Aged [employer] and counseling on the following was provided based on the above review of health and a plan/checklist for the patient, along with additional information discussed, was provided for the patient in the patient instructions :  -Advised on importance of completing advanced directives, discussed options for completing and provided information in patient instructions as well -Advised and counseled on a healthy lifestyle  -Reviewed patient's  current diet. Advised and counseled on a whole foods based healthy diet. A summary of a healthy diet was provided in the Patient Instructions.Congratulated on healthy choices.  -reviewed patient's current physical activity level and discussed exercise guidelines for adults. Discussed community resources and ideas for safe exercise at home to assist in meeting exercise guideline recommendations in a safe and healthy way.  -Advise yearly dental visits at minimum and regular eye exams -Advised and counseled on tobacco use and offered counseling, discussed could try gum/lozenge - right now she feels is more of a physical habit she enjoys with morning coffee, than chemical dependency  Follow up: see patient instructions     Patient Instructions  I really enjoyed getting to talk with you today! I am available on Tuesdays and Thursdays for virtual visits if you have any questions or concerns, or if I can be of any further assistance.   CHECKLIST FROM ANNUAL WELLNESS VISIT:  -Follow up (please call to schedule if not scheduled after visit):   -yearly for annual wellness visit with primary care office  Here is a list of your preventive care/health maintenance measures and the plan for each if any are due:  PLAN For any measures below that may be due:  -can get vaccines at pharmacy - please let us know if you do so that we can update your chart, can get the flu shot at the office  Health Maintenance  Topic Date Due   DTaP/Tdap/Td (1 - Tdap) Never done   Zoster Vaccines- Shingrix (1 of 2) Never done   COVID-19 Vaccine (3 - Moderna risk series) 01/10/2020   INFLUENZA VACCINE  04/09/2023   Medicare Annual Wellness (AWV)  06/17/2024   MAMMOGRAM  12/30/2024   Colonoscopy  09/09/2027   Pneumonia Vaccine 53+ Years old  Completed   DEXA SCAN  Completed   Hepatitis C Screening  Completed   HPV VACCINES  Aged Out    -See a dentist at least yearly  -Get your eyes checked and then per your eye  specialist's recommendations  -Other issues addressed today:  -advise to quit smoking, 1-800-QUIT NOW offers free counseling.    -I have included below further information regarding a healthy whole foods based diet, physical activity guidelines for adults, stress management and opportunities for social connections. I hope you find this information useful.   -----------------------------------------------------------------------------------------------------------------------------------------------------------------------------------------------------------------------------------------------------------  NUTRITION: -eat real food: lots of colorful vegetables (half the plate) and fruits -5-7 servings of vegetables and fruits per day (fresh or steamed is best), exp. 2 servings of vegetables with lunch and dinner and 2 servings of fruit per day. Berries and greens such as kale and collards are great choices.  -consume on a regular basis: whole grains (make sure first ingredient on label contains the word "whole"), fresh fruits, fish, nuts, seeds, healthy oils (such as olive oil, avocado oil, grape seed oil) -may eat small amounts of dairy and lean meat on occasion, but avoid processed meats such as ham, bacon, lunch meat, etc. -drink water -try to avoid fast food and pre-packaged foods, processed meat -most experts advise limiting sodium to < 2300mg  per day, should limit further is any chronic conditions such as high blood pressure, heart disease, diabetes, etc. The American Heart Association advised that < 1500mg  is is ideal -try to avoid foods that contain any ingredients with names you do not recognize  -try to avoid sugar/sweets (except for the natural sugar that occurs in fresh fruit) -try to avoid sweet drinks -try to avoid white rice, white bread, pasta (unless whole grain), white or yellow potatoes  EXERCISE GUIDELINES FOR ADULTS: -if you wish to increase your physical activity, do so  gradually and with the approval of your doctor -STOP and seek medical care immediately if you have any chest pain, chest discomfort or trouble breathing when starting or increasing exercise  -move and stretch your body, legs, feet and arms when sitting for long periods -Physical activity guidelines for optimal health in adults: -least 150 minutes per week of aerobic exercise (can talk, but not sing) once approved by your doctor, 20-30 minutes of sustained activity or two 10 minute episodes of sustained activity every day.  -resistance training at least 2 days per week if approved by your doctor -balance exercises 3+ days per week:   Stand somewhere where you have something sturdy to hold onto if you lose balance.    1) lift up on toes, start with 5x per day and work up to 20x   2) stand and lift on leg straight out to the side so that foot is a few inches of the floor, start with 5x each side and work up to 20x each side   3) stand on one foot, start with 5 seconds each side and work up to 20 seconds on each side  If you need ideas or help with getting more active:  -Silver sneakers https://tools.silversneakers.com  -Walk with a Doc:  http://www.duncan-williams.com/  -try to include resistance (weight lifting/strength building) and balance exercises twice per week: or the following link for ideas: http://castillo-powell.com/  BuyDucts.dk  STRESS MANAGEMENT: -can try meditating, or just sitting quietly with deep breathing while intentionally relaxing all parts of your body for 5 minutes daily -if you need further help with stress, anxiety or depression please follow up with your primary doctor or contact the wonderful folks at [facility]: [phone]  SOCIAL CONNECTIONS: -options in [place] if you wish to engage in more social and exercise related activities:  -Silver  sneakers https://tools.silversneakers.com  -Walk with a Doc: http://www.duncan-williams.com/  -Check out the [facility] Active Adults 50+ section on the [place] of [employer] (hiking clubs, book clubs, cards and games, chess, exercise classes, aquatic classes and much more) - see the website for details: https://www.[place].gov/departments/parks-recreation/active-adults50  -YouTube has lots of exercise videos for different ages and abilities as well  -[facility] (a variety of indoor and outdoor inperson activities for adults). [phone]. [address]  -Virtual Online Classes (a variety of topics): see seniorplanet.org or call [phone]  -consider volunteering at a school, hospice center, church, senior center or elsewhere    ADVANCED HEALTHCARE DIRECTIVES:  [place] Advanced Directives assistance:   ExpressWeek.com.cy  Everyone should have advanced health care directives in place. This is so that you get the care you want, should you ever be in a situation where you are unable to make your own medical decisions.   From the [place] Advanced Directive Website: "Advance Health Care Directives are legal documents in which you give written instructions about your health care if, in the future, you cannot speak for yourself.   A health care power of attorney allows you to name a person you trust to make your health care decisions if you cannot make them yourself. A declaration of a desire for a natural death (or living will) is document, which states that you desire not to have your life prolonged by extraordinary measures if you have a terminal or incurable illness or if you are in a vegetative state. An advance instruction for mental health treatment makes a declaration of instructions, information and preferences regarding your mental health treatment. It also states that you are aware that the advance  instruction authorizes a mental health treatment provider to act according to your wishes. It may also outline your consent or refusal of mental health treatment. A declaration of an anatomical gift allows anyone over the age of 17 to make a gift by will, organ donor card or other document."   Please see the following website or an elder law attorney for forms, FAQs and for completion of advanced directives: [place] TEFL teacher Health Care Directives Advance Health Care Directives (http://guzman.com/)  Or copy and paste the following to your web browser: PoshChat.fi         Terressa Koyanagi, DO

## 2023-06-25 ENCOUNTER — Ambulatory Visit (INDEPENDENT_AMBULATORY_CARE_PROVIDER_SITE_OTHER): Payer: Medicare Other | Admitting: Clinical

## 2023-06-25 DIAGNOSIS — F419 Anxiety disorder, unspecified: Secondary | ICD-10-CM

## 2023-06-25 DIAGNOSIS — F331 Major depressive disorder, recurrent, moderate: Secondary | ICD-10-CM | POA: Diagnosis not present

## 2023-06-25 DIAGNOSIS — T7431XD Adult psychological abuse, confirmed, subsequent encounter: Secondary | ICD-10-CM
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
## 2023-06-26 ENCOUNTER — Encounter (HOSPITAL_COMMUNITY): Payer: Self-pay | Admitting: Clinical

## 2023-07-02 ENCOUNTER — Ambulatory Visit (INDEPENDENT_AMBULATORY_CARE_PROVIDER_SITE_OTHER): Payer: Medicare Other | Admitting: Clinical

## 2023-07-02 ENCOUNTER — Encounter (HOSPITAL_COMMUNITY): Payer: Self-pay | Admitting: Clinical

## 2023-07-02 DIAGNOSIS — F330 Major depressive disorder, recurrent, mild: Secondary | ICD-10-CM | POA: Diagnosis not present

## 2023-07-02 DIAGNOSIS — F418 Other specified anxiety disorders: Secondary | ICD-10-CM | POA: Diagnosis not present
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
## 2023-07-08 ENCOUNTER — Encounter (HOSPITAL_COMMUNITY): Payer: Self-pay | Admitting: Clinical

## 2023-07-08 ENCOUNTER — Ambulatory Visit (INDEPENDENT_AMBULATORY_CARE_PROVIDER_SITE_OTHER): Payer: Medicare Other | Admitting: Clinical

## 2023-07-08 DIAGNOSIS — F330 Major depressive disorder, recurrent, mild: Secondary | ICD-10-CM

## 2023-07-08 DIAGNOSIS — F419 Anxiety disorder, unspecified: Secondary | ICD-10-CM | POA: Diagnosis not present

## 2023-07-08 DIAGNOSIS — T7431XD Adult psychological abuse, confirmed, subsequent encounter: Secondary | ICD-10-CM | POA: Diagnosis not present
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
## 2023-07-15 ENCOUNTER — Encounter (HOSPITAL_COMMUNITY): Payer: Self-pay | Admitting: Clinical

## 2023-07-15 ENCOUNTER — Ambulatory Visit (HOSPITAL_COMMUNITY): Payer: Medicare Other | Admitting: Clinical

## 2023-07-15 DIAGNOSIS — T7431XD Adult psychological abuse, confirmed, subsequent encounter: Secondary | ICD-10-CM

## 2023-07-15 DIAGNOSIS — F330 Major depressive disorder, recurrent, mild: Secondary | ICD-10-CM

## 2023-07-15 DIAGNOSIS — F419 Anxiety disorder, unspecified: Secondary | ICD-10-CM
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
## 2023-07-21 ENCOUNTER — Ambulatory Visit: Payer: Medicare Other | Admitting: Adult Health

## 2023-07-21 ENCOUNTER — Encounter: Payer: Self-pay | Admitting: Adult Health

## 2023-07-21 VITALS — BP 100/70 | HR 81 | Temp 98.3°F | Ht 63.5 in | Wt 108.0 lb

## 2023-07-21 DIAGNOSIS — F32A Depression, unspecified: Secondary | ICD-10-CM

## 2023-07-21 DIAGNOSIS — Z0000 Encounter for general adult medical examination without abnormal findings: Secondary | ICD-10-CM

## 2023-07-21 DIAGNOSIS — F418 Other specified anxiety disorders: Secondary | ICD-10-CM

## 2023-07-21 DIAGNOSIS — M8589 Other specified disorders of bone density and structure, multiple sites: Secondary | ICD-10-CM | POA: Diagnosis not present

## 2023-07-21 DIAGNOSIS — Z0001 Encounter for general adult medical examination with abnormal findings: Secondary | ICD-10-CM

## 2023-07-21 DIAGNOSIS — C569 Malignant neoplasm of unspecified ovary: Secondary | ICD-10-CM

## 2023-07-21 DIAGNOSIS — F419 Anxiety disorder, unspecified: Secondary | ICD-10-CM

## 2023-07-21 DIAGNOSIS — Z23 Encounter for immunization: Secondary | ICD-10-CM

## 2023-07-21 LAB — CBC WITH DIFFERENTIAL/PLATELET
Basophils Absolute: 0.1 10*3/uL (ref 0.0–0.1)
Basophils Relative: 1.2 % (ref 0.0–3.0)
Eosinophils Absolute: 0.1 10*3/uL (ref 0.0–0.7)
Eosinophils Relative: 1.5 % (ref 0.0–5.0)
HCT: 33.7 % — ABNORMAL LOW (ref 36.0–46.0)
Hemoglobin: 11.4 g/dL — ABNORMAL LOW (ref 12.0–15.0)
Lymphocytes Relative: 45.3 % (ref 12.0–46.0)
Lymphs Abs: 2.4 10*3/uL (ref 0.7–4.0)
MCHC: 33.8 g/dL (ref 30.0–36.0)
MCV: 97.4 fL (ref 78.0–100.0)
Monocytes Absolute: 0.4 10*3/uL (ref 0.1–1.0)
Monocytes Relative: 7 % (ref 3.0–12.0)
Neutro Abs: 2.4 10*3/uL (ref 1.4–7.7)
Neutrophils Relative %: 45 % (ref 43.0–77.0)
Platelets: 298 10*3/uL (ref 150.0–400.0)
RBC: 3.46 Mil/uL — ABNORMAL LOW (ref 3.87–5.11)
RDW: 14.4 % (ref 11.5–15.5)
WBC: 5.3 10*3/uL (ref 4.0–10.5)

## 2023-07-21 LAB — COMPREHENSIVE METABOLIC PANEL
ALT: 9 U/L (ref 0–35)
AST: 16 U/L (ref 0–37)
Albumin: 4.4 g/dL (ref 3.5–5.2)
Alkaline Phosphatase: 26 U/L — ABNORMAL LOW (ref 39–117)
BUN: 11 mg/dL (ref 6–23)
CO2: 28 meq/L (ref 19–32)
Calcium: 9.3 mg/dL (ref 8.4–10.5)
Chloride: 103 meq/L (ref 96–112)
Creatinine, Ser: 0.61 mg/dL (ref 0.40–1.20)
GFR: 92.02 mL/min (ref >60.00)
Glucose, Bld: 94 mg/dL (ref 70–99)
Potassium: 4.2 meq/L (ref 3.5–5.1)
Sodium: 138 meq/L (ref 135–145)
Total Bilirubin: 0.5 mg/dL (ref 0.2–1.2)
Total Protein: 6.8 g/dL (ref 6.0–8.3)

## 2023-07-21 LAB — TSH: TSH: 2.53 u[IU]/mL (ref 0.35–5.50)

## 2023-07-21 LAB — LIPID PANEL
Cholesterol: 173 mg/dL (ref 0–200)
HDL: 69.2 mg/dL (ref >39.00)
LDL Cholesterol: 92 mg/dL (ref 0–99)
NonHDL: 104.16
Total CHOL/HDL Ratio: 3
Triglycerides: 59 mg/dL (ref 0.0–149.0)
VLDL: 11.8 mg/dL (ref 0.0–40.0)

## 2023-07-21 LAB — VITAMIN B12: Vitamin B-12: 458 pg/mL (ref 211–911)

## 2023-07-21 LAB — VITAMIN D 25 HYDROXY (VIT D DEFICIENCY, FRACTURES): VITD: 91.87 ng/mL (ref 30.00–100.00)

## 2023-07-21 NOTE — Patient Instructions (Signed)
It was great seeing you today   We will follow up with you regarding your lab work   Please let me know if you need anything   

## 2023-07-21 NOTE — Progress Notes (Signed)
Subjective:    Patient ID: Melissa Holmes, female    DOB: 1956/04/05, 67 y.o.   MRN: [id number]  HPI Patient presents for yearly preventative medicine examination. She is a 67 year old female who  has a past medical history of Allergy, Colon polyps, History of chicken pox, and Ovarian cancer (HCC).  History of ovarian cancer-diagnosed in 2019 at which time she had a hysterectomy.  She is followed by GYN oncology and is seen every 6 months until December 2024  Anxiety/Depression-takes Xanax 1 mg at bedtime and is currently seeing a counselor and finds this helpful.   Osteopenia -last bone density in April 2024 showed osteopenia with a T-score of 1.6. She is taking vitamin D 5000 units.   All immunizations and health maintenance protocols were reviewed with the patient and needed orders were placed.  Appropriate screening laboratory values were ordered for the patient including screening of hyperlipidemia, renal function and hepatic function.  Medication reconciliation,  past medical history, social history, problem list and allergies were reviewed in detail with the patient  Goals were established with regard to weight loss, exercise, and  diet in compliance with medications.She stays active with exercise and is now working part time at [employer]. She eats a heart healthy diet   She is up to date on routine colon cancer screening   Review of Systems  Constitutional: Negative.   HENT: Negative.    Eyes: Negative.   Respiratory: Negative.    Cardiovascular: Negative.   Gastrointestinal: Negative.   Endocrine: Negative.   Genitourinary: Negative.   Musculoskeletal:  Positive for arthralgias.  Skin: Negative.   Allergic/Immunologic: Negative.   Neurological: Negative.   Hematological: Negative.   Psychiatric/Behavioral: Negative.     Past Medical History:  Diagnosis Date   Allergy    Colon polyps    history of precancerous   History of chicken pox    Ovarian cancer  (HCC)    diagnosed 2 years ago-treated by a physician in [place]    Social History   Socioeconomic History   Marital status: Married    Spouse name: Not on file   Number of children: Not on file   Years of education: Not on file   Highest education level: Not on file  Occupational History   Not on file  Tobacco Use   Smoking status: Some Days    Types: Cigarettes   Smokeless tobacco: Never   Tobacco comments:    2 cigarettes daily  Vaping Use   Vaping status: Never Used  Substance and Sexual Activity   Alcohol use: Yes    Comment: Occ wine   Drug use: Not Currently   Sexual activity: Not Currently  Other Topics Concern   Not on file  Social History Narrative   Not on file   Social Determinants of Health   Financial Resource Strain: Low Risk  (06/11/2022)   Overall Financial Resource Strain (CARDIA)    Difficulty of Paying Living Expenses: Not hard at all  Food Insecurity: No Food Insecurity (06/11/2022)   Hunger Vital Sign    Worried About Running Out of Food in the Last Year: Never true    Ran Out of Food in the Last Year: Never true  Transportation Needs: No Transportation Needs (06/11/2022)   PRAPARE - Administrator, Civil Service (Medical): No    Lack of Transportation (Non-Medical): No  Physical Activity: Sufficiently Active (06/11/2022)   Exercise Vital Sign    Days of  Exercise per Week: 5 days    Minutes of Exercise per Session: 30 min  Stress: No Stress Concern Present (06/11/2022)   [employer] of Occupational Health - Occupational Stress Questionnaire    Feeling of Stress : Not at all  Social Connections: Moderately Isolated (06/11/2022)   Social Connection and Isolation Panel [NHANES]    Frequency of Communication with Friends and Family: More than three times a week    Frequency of Social Gatherings with Friends and Family: More than three times a week    Attends Religious Services: Never    Database administrator or Organizations: No     Attends Banker Meetings: Never    Marital Status: Married  Catering manager Violence: Not At Risk (06/11/2022)   Humiliation, Afraid, Rape, and Kick questionnaire    Fear of Current or Ex-Partner: No    Emotionally Abused: No    Physically Abused: No    Sexually Abused: No    Past Surgical History:  Procedure Laterality Date   ABDOMINAL HYSTERECTOMY  2019   APPENDECTOMY  2019   BREAST BIOPSY Right 2019   benign per patient   TONSILLECTOMY  1965    Family History  Problem Relation Age of Onset   Arthritis Mother    Diabetes Mother    Hearing loss Mother    Hypertension Mother    Hyperlipidemia Mother    Depression Father    Heart disease Father    Heart attack Father    Multiple myeloma Brother    Early death Brother    Hearing loss Maternal Grandmother    Alcohol abuse Maternal Grandfather    Cancer Maternal Grandfather    Cancer Paternal Grandmother    Early death Paternal Grandmother    Miscarriages / Stillbirths Paternal Grandmother    Diabetes Paternal Grandfather    Colon cancer Neg Hx    Breast cancer Neg Hx    Ovarian cancer Neg Hx    Endometrial cancer Neg Hx    Pancreatic cancer Neg Hx    Prostate cancer Neg Hx     Allergies  Allergen Reactions   Bee Venom Other (See Comments)    Current Outpatient Medications on File Prior to Visit  Medication Sig Dispense Refill   ALPRAZolam (XANAX) 1 MG tablet TAKE 1 TABLET(1 MG) BY MOUTH DAILY AS NEEDED FOR ANXIETY 30 tablet 2   ASCORBIC ACID PO Take by mouth daily in the afternoon.     Cholecalciferol (D3 VITAMIN PO) Take by mouth daily in the afternoon.     Lactobacillus (PROBIOTIC ACIDOPHILUS PO) Take by mouth daily in the afternoon.     magnesium gluconate (MAGONATE) 500 MG tablet Take 500 mg by mouth 2 (two) times daily.     No current facility-administered medications on file prior to visit.    BP 100/70   Pulse 81   Temp 98.3 F (36.8 C) (Oral)   Ht 5' 3.5" (1.613 m)   Wt 108 lb  (49 kg)   SpO2 96%   BMI 18.83 kg/m       Objective:   Physical Exam Vitals and nursing note reviewed.  Constitutional:      General: She is not in acute distress.    Appearance: Normal appearance. She is not ill-appearing.  HENT:     Head: Normocephalic and atraumatic.     Right Ear: Tympanic membrane, ear canal and external ear normal. There is no impacted cerumen.     Left Ear: Tympanic  membrane, ear canal and external ear normal. There is no impacted cerumen.     Nose: Nose normal. No congestion or rhinorrhea.     Mouth/Throat:     Mouth: Mucous membranes are moist.     Pharynx: Oropharynx is clear.  Eyes:     Extraocular Movements: Extraocular movements intact.     Conjunctiva/sclera: Conjunctivae normal.     Pupils: Pupils are equal, round, and reactive to light.  Neck:     Vascular: No carotid bruit.  Cardiovascular:     Rate and Rhythm: Normal rate and regular rhythm.     Pulses: Normal pulses.     Heart sounds: No murmur heard.    No friction rub. No gallop.  Pulmonary:     Effort: Pulmonary effort is normal.     Breath sounds: Normal breath sounds.  Abdominal:     General: Abdomen is flat. Bowel sounds are normal. There is no distension.     Palpations: Abdomen is soft. There is no mass.     Tenderness: There is no abdominal tenderness. There is no guarding or rebound.     Hernia: No hernia is present.  Musculoskeletal:        General: Normal range of motion.     Cervical back: Normal range of motion and neck supple.  Lymphadenopathy:     Cervical: No cervical adenopathy.  Skin:    General: Skin is warm and dry.     Capillary Refill: Capillary refill takes less than 2 seconds.  Neurological:     General: No focal deficit present.     Mental Status: She is alert and oriented to person, place, and time.  Psychiatric:        Mood and Affect: Mood normal.        Behavior: Behavior normal.        Thought Content: Thought content normal.        Judgment:  Judgment normal.       Assessment & Plan:  1. Routine general medical examination at a health care facility Today patient counseled on age appropriate routine health concerns for screening and prevention, each reviewed and up to date or declined. Immunizations reviewed and up to date or declined. Labs ordered and reviewed. Risk factors for depression reviewed and negative. Hearing function and visual acuity are intact. ADLs screened and addressed as needed. Functional ability and level of safety reviewed and appropriate. Education, counseling and referrals performed based on assessed risks today. Patient provided with a copy of personalized plan for preventive services. - Follow up in one year or sooner if needed - Continue to eat healthy and exercise  - Continue Xanax 1 mg  - CBC with Differential/Platelet; Future - Comprehensive metabolic panel; Future - Lipid panel; Future - TSH; Future - Vitamin B12; Future - VITAMIN D 25 Hydroxy (Vit-D Deficiency, Fractures); Future  3. Malignant neoplasm of ovary, unspecified laterality (HCC)  - CBC with Differential/Platelet; Future - Comprehensive metabolic panel; Future - Lipid panel; Future - TSH; Future - Vitamin B12; Future - VITAMIN D 25 Hydroxy (Vit-D Deficiency, Fractures); Future  4. Osteopenia of multiple sites - Start Calcium 1200 mg  - CBC with Differential/Platelet; Future - Comprehensive metabolic panel; Future - Lipid panel; Future - TSH; Future - Vitamin B12; Future - VITAMIN D 25 Hydroxy (Vit-D Deficiency, Fractures); Future  Shirline Frees, NP

## 2023-07-22 ENCOUNTER — Ambulatory Visit (HOSPITAL_COMMUNITY): Payer: Medicare Other | Admitting: Clinical

## 2023-07-22 ENCOUNTER — Encounter (HOSPITAL_COMMUNITY): Payer: Self-pay | Admitting: Clinical

## 2023-07-22 DIAGNOSIS — T7431XD Adult psychological abuse, confirmed, subsequent encounter: Secondary | ICD-10-CM

## 2023-07-22 DIAGNOSIS — F330 Major depressive disorder, recurrent, mild: Secondary | ICD-10-CM | POA: Diagnosis not present

## 2023-07-22 DIAGNOSIS — F419 Anxiety disorder, unspecified: Secondary | ICD-10-CM
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
## 2023-07-29 ENCOUNTER — Ambulatory Visit (INDEPENDENT_AMBULATORY_CARE_PROVIDER_SITE_OTHER): Payer: Medicare Other | Admitting: Clinical

## 2023-07-29 DIAGNOSIS — F330 Major depressive disorder, recurrent, mild: Secondary | ICD-10-CM | POA: Diagnosis not present

## 2023-07-29 DIAGNOSIS — F419 Anxiety disorder, unspecified: Secondary | ICD-10-CM

## 2023-07-30 ENCOUNTER — Encounter (HOSPITAL_COMMUNITY): Payer: Self-pay | Admitting: Clinical
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
## 2023-08-01 IMAGING — MG MM DIGITAL SCREENING BILAT W/ TOMO AND CAD
8 series · 9 of 24 positions shown · non-contrast
Comparison: Previous exam(s).

CLINICAL DATA: Screening.

EXAM:
DIGITAL SCREENING BILATERAL MAMMOGRAM WITH TOMOSYNTHESIS AND CAD
TECHNIQUE: Bilateral screening digital craniocaudal and mediolateral oblique
mammograms were obtained. Bilateral screening digital breast
tomosynthesis was performed. The images were evaluated with
computer-aided detection.

[R MLO synth-2D]
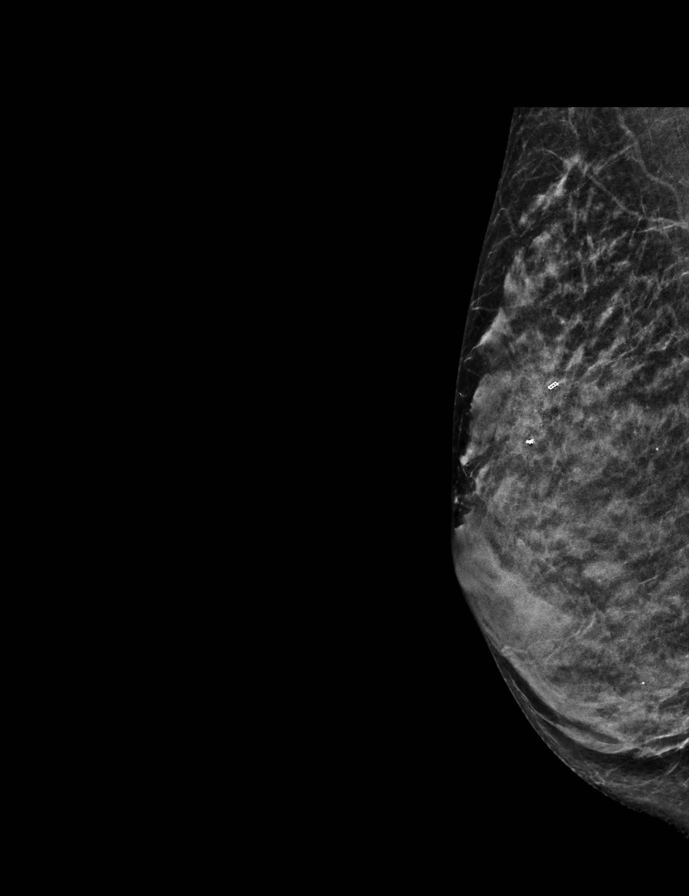

[L CC synth-2D]
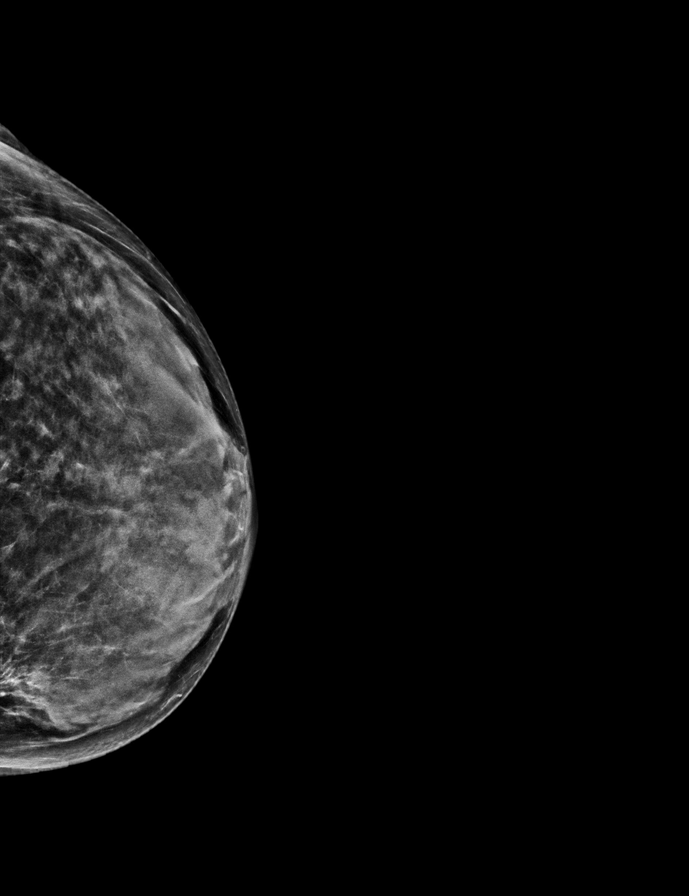

[L MLO synth-2D]
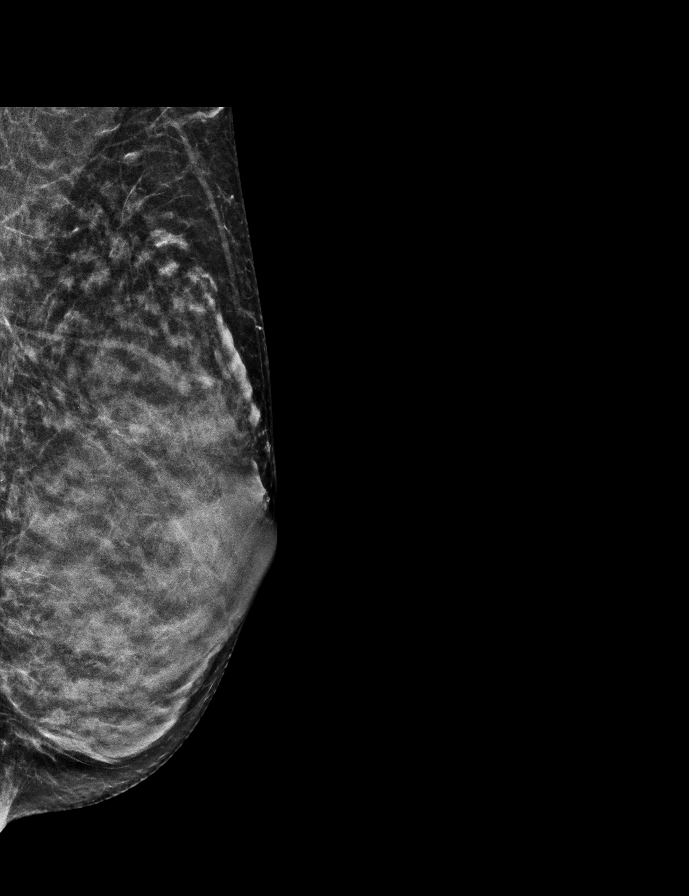

[R CC synth-2D]
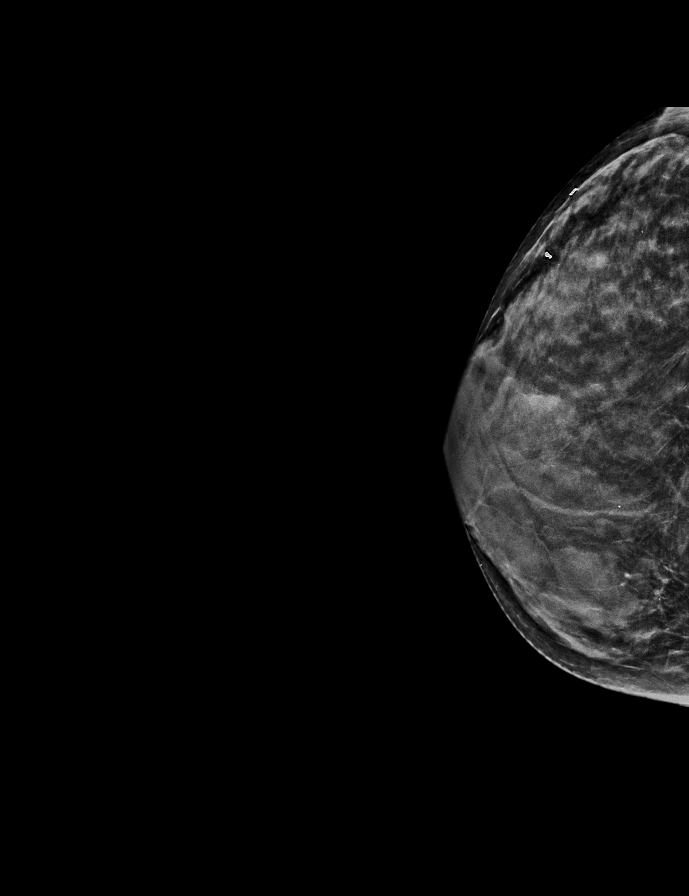

[L CC tomo · 2 of 52 frames shown]
[frame 17/52]
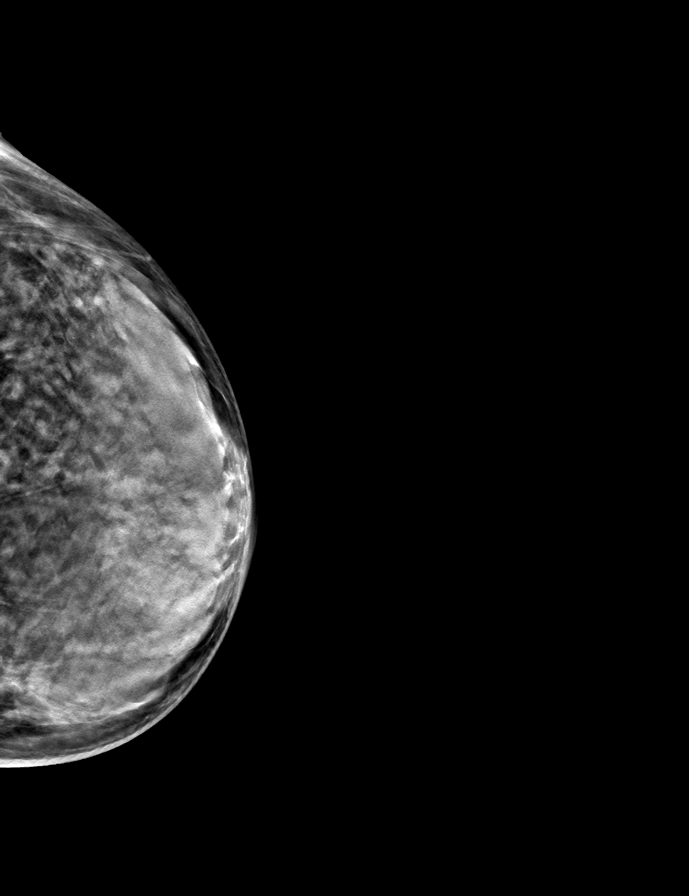
[frame 27/52]
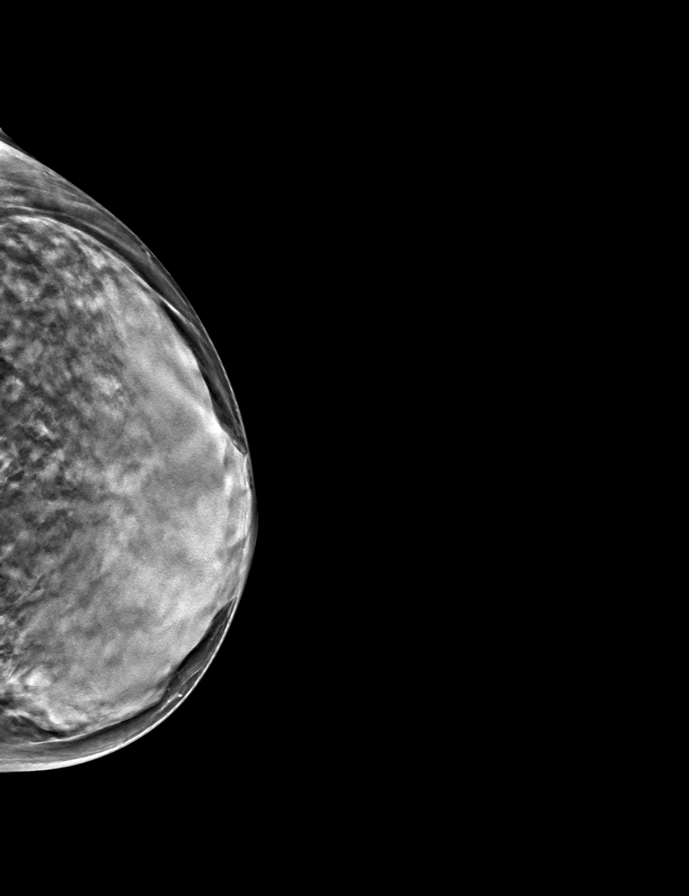

[R MLO tomo · tomo slice 22/43.0]
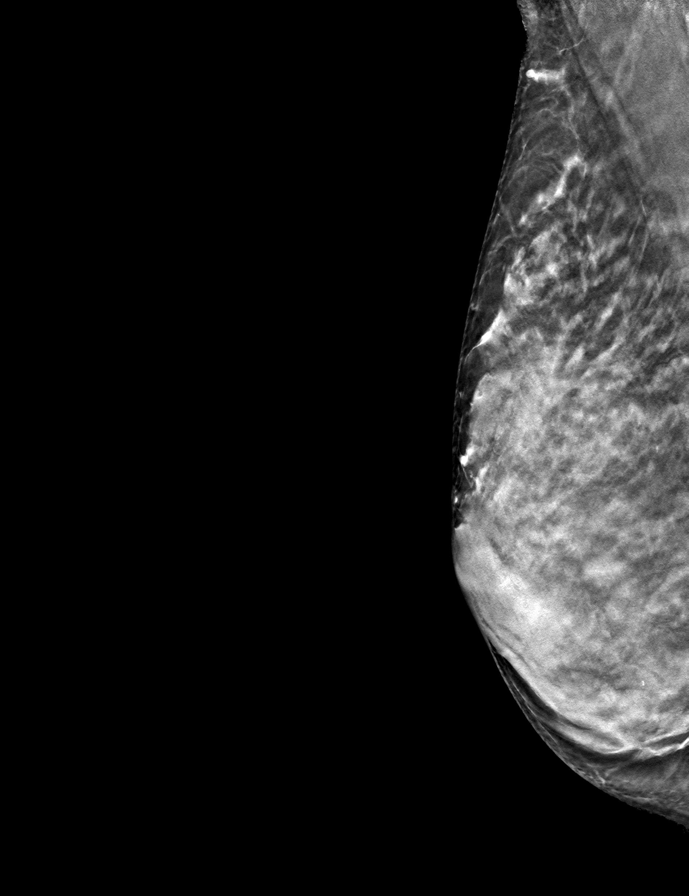

[L MLO tomo · tomo slice 24/47.0]
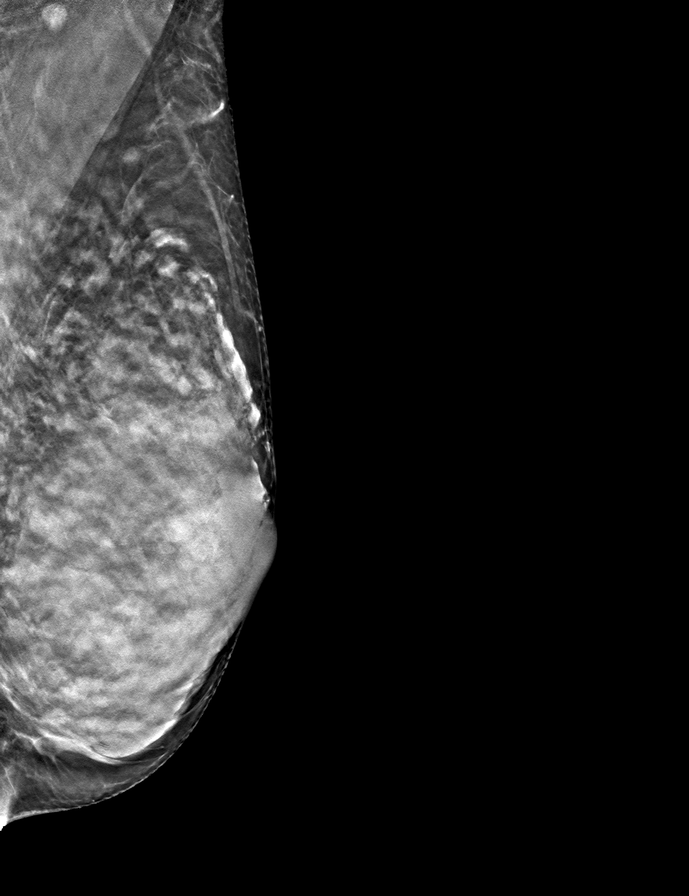

[R CC tomo · tomo slice 26/51.0]
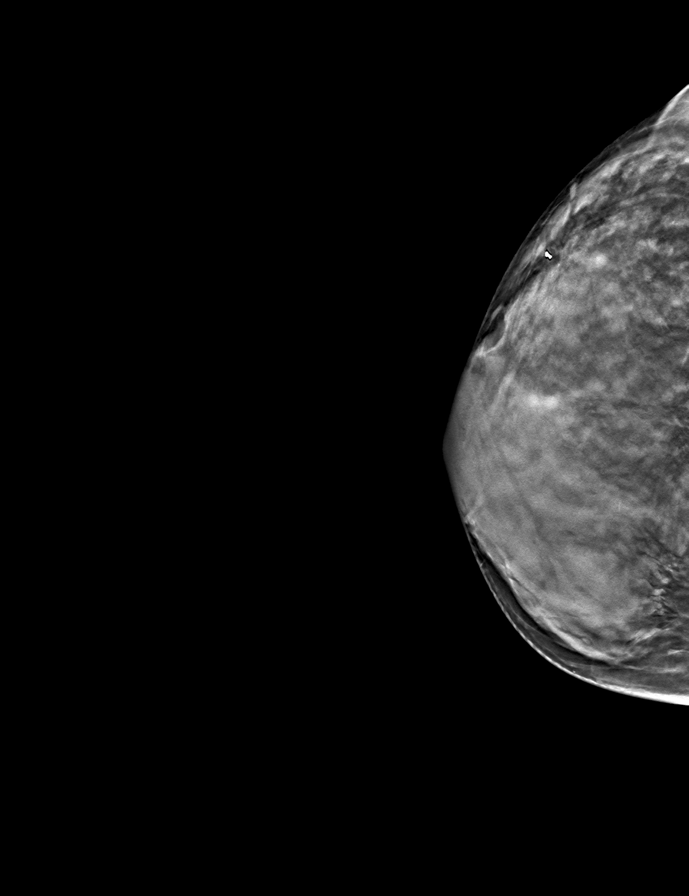

[9 of 24 positions shown; findings below may reference images not displayed]

ACR Breast Density Category c: The breast tissue is heterogeneously
dense, which may obscure small masses.
FINDINGS: There are no findings suspicious for malignancy.
IMPRESSION: No mammographic evidence of malignancy. A result letter of this
screening mammogram will be mailed directly to the patient.

RECOMMENDATION:
Screening mammogram in one year. (Code:[id number]-W-BC3)

BI-RADS CATEGORY  1: Negative.

## 2023-08-05 ENCOUNTER — Ambulatory Visit (INDEPENDENT_AMBULATORY_CARE_PROVIDER_SITE_OTHER): Payer: Medicare Other | Admitting: Clinical

## 2023-08-05 ENCOUNTER — Encounter (HOSPITAL_COMMUNITY): Payer: Self-pay | Admitting: Clinical

## 2023-08-05 DIAGNOSIS — Z630 Problems in relationship with spouse or partner: Secondary | ICD-10-CM | POA: Diagnosis not present

## 2023-08-05 DIAGNOSIS — F330 Major depressive disorder, recurrent, mild: Secondary | ICD-10-CM | POA: Diagnosis not present

## 2023-08-05 DIAGNOSIS — F419 Anxiety disorder, unspecified: Secondary | ICD-10-CM | POA: Diagnosis not present
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
## 2023-08-09 ENCOUNTER — Other Ambulatory Visit: Payer: Self-pay | Admitting: Adult Health

## 2023-08-09 DIAGNOSIS — F419 Anxiety disorder, unspecified: Secondary | ICD-10-CM

## 2023-08-11 NOTE — Telephone Encounter (Signed)
Ok to fill 

## 2023-08-12 ENCOUNTER — Encounter (HOSPITAL_COMMUNITY): Payer: Self-pay | Admitting: Clinical

## 2023-08-12 ENCOUNTER — Ambulatory Visit (INDEPENDENT_AMBULATORY_CARE_PROVIDER_SITE_OTHER): Payer: Medicare Other | Admitting: Clinical

## 2023-08-12 DIAGNOSIS — F330 Major depressive disorder, recurrent, mild: Secondary | ICD-10-CM | POA: Diagnosis not present

## 2023-08-12 DIAGNOSIS — F419 Anxiety disorder, unspecified: Secondary | ICD-10-CM | POA: Diagnosis not present
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
## 2023-08-17 NOTE — Patient Instructions (Signed)
Your exam today is normal with no concerning findings. We will contact you with the results of your CA 125 from today. Plan to follow up in six months with repeat CA 125 lab draw at that time or sooner if needed.   Happy Holidays!   Symptoms to report to your health care team include vaginal bleeding, rectal bleeding, bloating, weight loss without effort, new and persistent pain, new and  persistent fatigue, new leg swelling, new masses (i.e., bumps in your neck or groin), new and persistent cough, new and persistent nausea and vomiting, change in bowel or bladder habits, and any other concerns.

## 2023-08-18 ENCOUNTER — Inpatient Hospital Stay: Payer: Medicare Other | Attending: Gynecologic Oncology

## 2023-08-18 ENCOUNTER — Inpatient Hospital Stay (HOSPITAL_BASED_OUTPATIENT_CLINIC_OR_DEPARTMENT_OTHER): Payer: Medicare Other | Admitting: Gynecologic Oncology

## 2023-08-18 VITALS — BP 105/50 | HR 66 | Temp 98.3°F | Resp 16 | Ht 62.0 in | Wt 109.0 lb

## 2023-08-18 DIAGNOSIS — Z90722 Acquired absence of ovaries, bilateral: Secondary | ICD-10-CM | POA: Insufficient documentation

## 2023-08-18 DIAGNOSIS — Z90710 Acquired absence of both cervix and uterus: Secondary | ICD-10-CM | POA: Diagnosis not present

## 2023-08-18 DIAGNOSIS — Z8543 Personal history of malignant neoplasm of ovary: Secondary | ICD-10-CM | POA: Diagnosis not present

## 2023-08-18 DIAGNOSIS — C569 Malignant neoplasm of unspecified ovary: Secondary | ICD-10-CM

## 2023-08-18 NOTE — Progress Notes (Signed)
Gynecologic Oncology Follow Up  Consult was requested by Duane Lope for the evaluation of Melissa Holmes 67 y.o. 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  Assessment/Plan: Melissa Holmes is a 67 y.o. 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  No evidence of recurrence/disease on today's exam.   She will be contacted with the results of her CA 125 drawn today. Signs and symptoms of recurrence given in after visit summary. She has completed five years of office visits at 6 monthly intervals for surveillance exams and CA 125 assessments. She can follow up with her PCP for her continued well woman care and also offered annual follow up in our office if desired for survivorship. Patient would like to continue with her PCP. She can continue with annual CA 125 checks with her regular lab work. No follow up necessary at this time with our office unless needs arise in the future. She is advised to call for any needs, concerns, or new symptoms.   HPI: Melissa Holmes is a 67 year old female P0 initially seen in consultation at the request of Duane Lope NP for evaluation of a history of right ovarian cancer diagnosed in 2019.  Per Dr. Andrey Farmer: In September 2019 when living in [place], she developed right lower quadrant pain and GI distress. She was seen in the emergency department and was operated on with a laparoscopic appendectomy on Labor Day weekend.  Approximately 1 month following the surgery, she developed new right lower quadrant pain and indigestion and was seen again in the emergency department. At this time, she was diagnosed with a right ovarian cystic mass that measured approximately 8 cm and was complex cyst, cystic and solid.  She was taken to the operating room for laparoscopic BSO on July 01, 2018.  Pathology from this  specimen revealed a FIGO grade 1 endometrioid adenocarcinoma of the right ovary with no surface involvement.  She met with Dr. Etter Sjogren at [facility] oncology in [facility] for definitive surgical staging that was performed on 07/21/2018.  This included a robotic hysterectomy, pelvic and para-aortic lymphadenectomy, omentectomy, peritoneal biopsies.  No residual carcinoma or metastatic carcinoma was identified in any of the specimens.  Complex atypical hyperplasia was noted within the endometrium with no invasion present.  This was felt to be unrelated to her ovarian carcinoma.  She received definitive staging as a Stage Ia grade 1 endometrioid adenocarcinoma of the right ovary.  She reported undergoing genetic testing at [facility] in [place] at the cancer center.  She reported this was negative for BRCA mutations.  No adjuvant therapy was recommended due to her early stage low-grade lesion.  She did not present for typical in person follow-up due to the emergence of the COVID-19 pandemic.  However she did intermittently have virtual visits with a general medical oncologist and had Ca 125 lab evaluations at 3 monthly intervals.  She relocated from [place] to [place] in the summer 2021 in order to be closer in proximity to her elderly mother.  At that time she established care with Duane Lope NP. Ca 125 was last drawn on 08/2022 and was normal at 10.  Interval Hx: She presents today to the office for continued follow-up. Her mother is currently in the hospital. This is very distressing with the outcome of workup unknown  in regards if she will improve. The patient continues working at [employer] and has started [employer]. She is staying very busy. She reports tolerating her diet with no nausea or emesis. Does have decreased appetite at times and has had some weight loss. She continues to read when able.   No cough or chest pain.  No  abdominal pain, bloating, early satiety reported.  No dysuria or hematuria reported. No vaginal bleeding or discharge reported.  Bowels are functioning normally for her. She was having loose stools with 500 mg of magnesium nightly and has decreased this to 250 mg. She continues to see a therapist weekly and feels this is beneficial. No symptoms concerning for recurrence voiced.   Current Meds:  Outpatient Encounter Medications as of 08/18/2023  Medication Sig   ALPRAZolam (XANAX) 1 MG tablet TAKE 1 TABLET(1 MG) BY MOUTH DAILY AS NEEDED FOR ANXIETY   ASCORBIC ACID PO Take by mouth daily in the afternoon.   Cholecalciferol (D3 VITAMIN PO) Take by mouth daily in the afternoon.   Lactobacillus (PROBIOTIC ACIDOPHILUS PO) Take by mouth daily in the afternoon.   magnesium gluconate (MAGONATE) 500 MG tablet Take 500 mg by mouth 2 (two) times daily.   No facility-administered encounter medications on file as of 08/18/2023.    Allergy:  Allergies  Allergen Reactions   Bee Venom Other (See Comments)    Social Hx:   Social History   Socioeconomic History   Marital status: Married    Spouse name: Not on file   Number of children: Not on file   Years of education: Not on file   Highest education level: Not on file  Occupational History   Not on file  Tobacco Use   Smoking status: Some Days    Types: Cigarettes   Smokeless tobacco: Never   Tobacco comments:    2 cigarettes daily  Vaping Use   Vaping status: Never Used  Substance and Sexual Activity   Alcohol use: Yes    Comment: Occ wine   Drug use: Not Currently   Sexual activity: Not Currently  Other Topics Concern   Not on file  Social History Narrative   Not on file   Social Determinants of Health   Financial Resource Strain: Low Risk  (06/11/2022)   Overall Financial Resource Strain (CARDIA)    Difficulty of Paying Living Expenses: Not hard at all  Food Insecurity: No Food Insecurity (06/11/2022)   Hunger Vital Sign     Worried About Running Out of Food in the Last Year: Never true    Ran Out of Food in the Last Year: Never true  Transportation Needs: No Transportation Needs (06/11/2022)   PRAPARE - Administrator, Civil Service (Medical): No    Lack of Transportation (Non-Medical): No  Physical Activity: Sufficiently Active (06/11/2022)   Exercise Vital Sign    Days of Exercise per Week: 5 days    Minutes of Exercise per Session: 30 min  Stress: No Stress Concern Present (06/11/2022)   [employer] of Occupational Health - Occupational Stress Questionnaire    Feeling of Stress : Not at all  Social Connections: Moderately Isolated (06/11/2022)   Social Connection and Isolation Panel [NHANES]    Frequency of Communication with Friends and Family: More than three times a week    Frequency of Social Gatherings with Friends and Family: More than three times a week    Attends Religious Services: Never    Active Member  of Clubs or Organizations: No    Attends Banker Meetings: Never    Marital Status: Married  Catering manager Violence: Not At Risk (06/11/2022)   Humiliation, Afraid, Rape, and Kick questionnaire    Fear of Current or Ex-Partner: No    Emotionally Abused: No    Physically Abused: No    Sexually Abused: No    Past Surgical Hx:  Past Surgical History:  Procedure Laterality Date   ABDOMINAL HYSTERECTOMY  2019   APPENDECTOMY  2019   BREAST BIOPSY Right 2019   benign per patient   TONSILLECTOMY  1965    Past Medical Hx:  Past Medical History:  Diagnosis Date   Allergy    Colon polyps    history of precancerous   History of chicken pox    Ovarian cancer (HCC)    diagnosed 2 years ago-treated by a physician in [place]    Past Gynecological History:  See HPI, G0 No LMP recorded. Patient has had a hysterectomy.  Family Hx:  Family History  Problem Relation Age of Onset   Arthritis Mother    Diabetes Mother    Hearing loss Mother    Hypertension  Mother    Hyperlipidemia Mother    Depression Father    Heart disease Father    Heart attack Father    Multiple myeloma Brother    Early death Brother    Hearing loss Maternal Grandmother    Alcohol abuse Maternal Grandfather    Cancer Maternal Grandfather    Cancer Paternal Grandmother    Early death Paternal Grandmother    Miscarriages / Stillbirths Paternal Grandmother    Diabetes Paternal Grandfather    Colon cancer Neg Hx    Breast cancer Neg Hx    Ovarian cancer Neg Hx    Endometrial cancer Neg Hx    Pancreatic cancer Neg Hx    Prostate cancer Neg Hx     Mammogram 12/2022 Bone density 12/2022 Colonoscopy in 2019  Review of Systems: ROS intake form positive for anxiety, unexplained weight loss.  Constitutional: Feels under stress. Has had weight loss. Decreased alcohol intake. No early satiety. No abnormal bloating symptoms.    ENT: Normal appearing ears and nares bilaterally Skin/Breast: No rash, jaundice, itching, dryness Cardiovascular: No chest pain, shortness of breath, or edema  Pulmonary: No cough or wheeze.  Gastro Intestinal: No nausea, vomiting, or diarrhea. No bright red blood per rectum, no abdominal pain, change in bowel movement, or constipation.  Genito Urinary: No dysuria, no bleeding Musculo Skeletal: No new myalgia, arthralgia, joint swelling or pain  Neurologic: No weakness, numbness, change in gait   Vitals:  Blood pressure (!) 105/50, pulse 66, temperature 98.3 F (36.8 C), temperature source Oral, resp. rate 16, height 5\' 2"  (1.575 m), weight 109 lb (49.4 kg), SpO2 100%.  Physical Exam: General: Well developed, well nourished female in no acute distress. Alert and oriented x 3.  Neck: Supple without any enlargements.  Lymph node survey: No cervical, supraclavicular, or inguinal adenopathy.  Cardiovascular: Regular rate and rhythm. S1 and S2 normal.  Lungs: Clear to auscultation bilaterally. No wheezes/crackles/rhonchi noted.  Skin: No rashes  present.      Abdomen: Abdomen soft, non-tender and non-obese. Active bowel sounds in all quadrants. No evidence of a fluid wave or abdominal masses. Incisions well healed without nodularity.  Genitourinary:    Vulva/vagina: Normal external female genitalia. No lesions. Cyst seen at previous exam has resolved.   Urethra: No lesions or  masses.    Vagina: Atrophic without any lesions. No palpable masses. No vaginal bleeding or drainage noted.  Rectal: Good tone, no masses, no cul de sac nodularity.  Extremities: No bilateral cyanosis, edema, or clubbing.    Doylene Bode, NP  08/18/2023, 12:44 PM

## 2023-08-19 ENCOUNTER — Ambulatory Visit (HOSPITAL_COMMUNITY): Payer: Medicare Other | Admitting: Clinical

## 2023-08-19 ENCOUNTER — Telehealth: Payer: Self-pay | Admitting: *Deleted

## 2023-08-19 ENCOUNTER — Encounter (HOSPITAL_COMMUNITY): Payer: Self-pay | Admitting: Clinical

## 2023-08-19 DIAGNOSIS — Z630 Problems in relationship with spouse or partner: Secondary | ICD-10-CM

## 2023-08-19 DIAGNOSIS — F419 Anxiety disorder, unspecified: Secondary | ICD-10-CM

## 2023-08-19 DIAGNOSIS — F330 Major depressive disorder, recurrent, mild: Secondary | ICD-10-CM

## 2023-08-19 LAB — CA 125: Cancer Antigen (CA) 125: 8.8 U/mL (ref 0.0–38.1)
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
## 2023-08-19 NOTE — Telephone Encounter (Signed)
-----   Message from Doylene Bode sent at 08/19/2023  7:44 AM EST ----- Please let her know: Your CA 125 level is within normal range and stable compared to previous values. ----- Message ----- From: Leory Plowman, [facility] [place] Sent: 08/19/2023   4:36 AM EST To: Doylene Bode, NP

## 2023-08-19 NOTE — Telephone Encounter (Signed)
Spoke with Melissa Holmes and relayed message from Warner Mccreedy, NP that patient's CA 125 level is within normal range and stable compare to previous values. Pt verbalized understanding and thanked the office for calling.

## 2023-08-26 ENCOUNTER — Ambulatory Visit (INDEPENDENT_AMBULATORY_CARE_PROVIDER_SITE_OTHER): Payer: Medicare Other | Admitting: Clinical

## 2023-08-26 ENCOUNTER — Encounter (HOSPITAL_COMMUNITY): Payer: Self-pay | Admitting: Clinical

## 2023-08-26 DIAGNOSIS — F330 Major depressive disorder, recurrent, mild: Secondary | ICD-10-CM | POA: Diagnosis not present

## 2023-08-26 DIAGNOSIS — Z630 Problems in relationship with spouse or partner: Secondary | ICD-10-CM | POA: Diagnosis not present

## 2023-08-26 DIAGNOSIS — Z634 Disappearance and death of family member: Secondary | ICD-10-CM
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
## 2023-09-01 ENCOUNTER — Ambulatory Visit (HOSPITAL_COMMUNITY): Payer: Medicare Other | Admitting: Clinical

## 2023-09-01 ENCOUNTER — Encounter (HOSPITAL_COMMUNITY): Payer: Self-pay | Admitting: Clinical

## 2023-09-01 DIAGNOSIS — Z630 Problems in relationship with spouse or partner: Secondary | ICD-10-CM

## 2023-09-01 DIAGNOSIS — F330 Major depressive disorder, recurrent, mild: Secondary | ICD-10-CM

## 2023-09-01 DIAGNOSIS — Z634 Disappearance and death of family member: Secondary | ICD-10-CM | POA: Diagnosis not present
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
## 2023-09-24 ENCOUNTER — Encounter (HOSPITAL_COMMUNITY): Payer: Self-pay | Admitting: Clinical

## 2023-09-24 ENCOUNTER — Ambulatory Visit (HOSPITAL_COMMUNITY): Payer: Medicare Other | Admitting: Clinical

## 2023-09-24 DIAGNOSIS — F419 Anxiety disorder, unspecified: Secondary | ICD-10-CM

## 2023-09-24 DIAGNOSIS — F330 Major depressive disorder, recurrent, mild: Secondary | ICD-10-CM | POA: Diagnosis not present

## 2023-09-24 DIAGNOSIS — Z630 Problems in relationship with spouse or partner: Secondary | ICD-10-CM

## 2023-09-24 DIAGNOSIS — Z634 Disappearance and death of family member: Secondary | ICD-10-CM
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
## 2023-10-01 ENCOUNTER — Encounter (HOSPITAL_COMMUNITY): Payer: Self-pay

## 2023-10-01 ENCOUNTER — Ambulatory Visit (HOSPITAL_COMMUNITY): Payer: Medicare Other | Admitting: Clinical

## 2023-10-07 ENCOUNTER — Encounter (HOSPITAL_COMMUNITY): Payer: Self-pay | Admitting: Clinical

## 2023-10-07 ENCOUNTER — Ambulatory Visit (INDEPENDENT_AMBULATORY_CARE_PROVIDER_SITE_OTHER): Payer: Medicare Other | Admitting: Clinical

## 2023-10-07 DIAGNOSIS — Z630 Problems in relationship with spouse or partner: Secondary | ICD-10-CM

## 2023-10-07 DIAGNOSIS — Z634 Disappearance and death of family member: Secondary | ICD-10-CM | POA: Diagnosis not present

## 2023-10-07 DIAGNOSIS — F419 Anxiety disorder, unspecified: Secondary | ICD-10-CM

## 2023-10-07 DIAGNOSIS — F330 Major depressive disorder, recurrent, mild: Secondary | ICD-10-CM
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
## 2023-10-14 ENCOUNTER — Ambulatory Visit (HOSPITAL_COMMUNITY): Payer: Medicare Other | Admitting: Clinical

## 2023-10-14 ENCOUNTER — Other Ambulatory Visit: Payer: Self-pay | Admitting: Adult Health

## 2023-10-14 ENCOUNTER — Encounter (HOSPITAL_COMMUNITY): Payer: Self-pay | Admitting: Clinical

## 2023-10-14 DIAGNOSIS — F330 Major depressive disorder, recurrent, mild: Secondary | ICD-10-CM | POA: Diagnosis not present

## 2023-10-14 DIAGNOSIS — F419 Anxiety disorder, unspecified: Secondary | ICD-10-CM

## 2023-10-14 DIAGNOSIS — T7431XD Adult psychological abuse, confirmed, subsequent encounter: Secondary | ICD-10-CM

## 2023-10-14 DIAGNOSIS — Z634 Disappearance and death of family member: Secondary | ICD-10-CM | POA: Diagnosis not present
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
## 2023-10-14 NOTE — Telephone Encounter (Signed)
 Copied from CRM [id number]. Topic: Clinical - Medication Refill >> Oct 14, 2023  9:32 AM Laurier BROCKS wrote: Most Recent Primary Care Visit:  Provider: MERNA HUXLEY  Department: [facility]  Visit Type: PHYSICAL  Date: 07/21/2023  Medication: ALPRAZolam  (XANAX ) 1 MG tablet  Has the patient contacted their pharmacy? Yes (Agent: If no, request that the patient contact the pharmacy for the refill. If patient does not wish to contact the pharmacy document the reason why and proceed with request.) (Agent: If yes, when and what did the pharmacy advise?)  Is this the correct pharmacy for this prescription? Yes If no, delete pharmacy and type the correct one.  This is the patient's preferred pharmacy:  [id number] [address] [place], [place] - [address] AT [facility] [address] [facility] [address] [place] [address] Phone: [phone] Fax: [phone]   Has the prescription been filled recently? No  Is the patient out of the medication? No  Has the patient been seen for an appointment in the last year OR does the patient have an upcoming appointment? Yes  Can we respond through MyChart? Yes  Agent: Please be advised that Rx refills may take up to 3 business days. We ask that you follow-up with your pharmacy.

## 2023-10-14 NOTE — Telephone Encounter (Signed)
 This RN contacted patient regarding refill for Xanax . Patient states that she called pharmacy and was told she has zero refills and needed to contact office regarding a new order. Current order shows 2 refills remaining. Patient states she has enough supply to last the rest of the month. Please advise.

## 2023-10-15 ENCOUNTER — Other Ambulatory Visit: Payer: Self-pay | Admitting: Adult Health

## 2023-10-15 NOTE — Telephone Encounter (Signed)
 Okay for refill?

## 2023-10-16 NOTE — Telephone Encounter (Signed)
 Per pharmacy pt picked this medication up 2 days ago. No further action needed.

## 2023-10-21 ENCOUNTER — Ambulatory Visit (INDEPENDENT_AMBULATORY_CARE_PROVIDER_SITE_OTHER): Payer: Medicare Other | Admitting: Clinical

## 2023-10-21 DIAGNOSIS — F419 Anxiety disorder, unspecified: Secondary | ICD-10-CM | POA: Diagnosis not present

## 2023-10-21 DIAGNOSIS — T7431XD Adult psychological abuse, confirmed, subsequent encounter: Secondary | ICD-10-CM

## 2023-10-21 DIAGNOSIS — Z634 Disappearance and death of family member: Secondary | ICD-10-CM | POA: Diagnosis not present

## 2023-10-21 DIAGNOSIS — F330 Major depressive disorder, recurrent, mild: Secondary | ICD-10-CM
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
## 2023-10-24 ENCOUNTER — Encounter (HOSPITAL_COMMUNITY): Payer: Self-pay | Admitting: Clinical

## 2023-10-28 ENCOUNTER — Encounter (HOSPITAL_COMMUNITY): Payer: Self-pay | Admitting: Clinical

## 2023-10-28 ENCOUNTER — Ambulatory Visit (INDEPENDENT_AMBULATORY_CARE_PROVIDER_SITE_OTHER): Payer: Medicare Other | Admitting: Clinical

## 2023-10-28 DIAGNOSIS — Z634 Disappearance and death of family member: Secondary | ICD-10-CM | POA: Diagnosis not present

## 2023-10-28 DIAGNOSIS — F419 Anxiety disorder, unspecified: Secondary | ICD-10-CM | POA: Diagnosis not present

## 2023-10-28 DIAGNOSIS — F330 Major depressive disorder, recurrent, mild: Secondary | ICD-10-CM | POA: Diagnosis not present
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
## 2023-11-04 ENCOUNTER — Ambulatory Visit (HOSPITAL_COMMUNITY): Payer: Medicare Other | Admitting: Clinical

## 2023-11-11 ENCOUNTER — Ambulatory Visit (HOSPITAL_COMMUNITY): Payer: Medicare Other | Admitting: Clinical

## 2023-11-11 ENCOUNTER — Encounter (HOSPITAL_COMMUNITY): Payer: Self-pay | Admitting: Clinical

## 2023-11-11 DIAGNOSIS — F330 Major depressive disorder, recurrent, mild: Secondary | ICD-10-CM | POA: Diagnosis not present

## 2023-11-11 DIAGNOSIS — Z634 Disappearance and death of family member: Secondary | ICD-10-CM | POA: Diagnosis not present

## 2023-11-11 DIAGNOSIS — F419 Anxiety disorder, unspecified: Secondary | ICD-10-CM
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
## 2023-11-12 ENCOUNTER — Telehealth: Payer: Self-pay | Admitting: *Deleted

## 2023-11-12 DIAGNOSIS — F419 Anxiety disorder, unspecified: Secondary | ICD-10-CM

## 2023-11-12 NOTE — Telephone Encounter (Signed)
 Copied from CRM [id number]. Topic: Clinical - Prescription Issue >> Nov 12, 2023  3:00 PM Fonda Kinder J wrote: Reason for CRM: Pt is requesting a callback regarding refused medication on 10/16/23  [phone]

## 2023-11-13 ENCOUNTER — Telehealth: Payer: Self-pay

## 2023-11-13 MED ORDER — ALPRAZOLAM 1 MG PO TABS: ORAL_TABLET | ORAL | 2 refills | Status: DC

## 2023-11-13 NOTE — Telephone Encounter (Signed)
 Pt notified that medication was never sent in for a refill. I did advised pt that I will send to [facility] for refill if appropriate.

## 2023-11-13 NOTE — Telephone Encounter (Signed)
 Pt notified today that Rx was sent to the pharmacy. Called pharmacy and they informed me that they are working on it.

## 2023-11-13 NOTE — Telephone Encounter (Signed)
 Copied from CRM [id number]. Topic: Clinical - Prescription Issue >> Nov 12, 2023  3:00 PM Fonda Kinder J wrote: Reason for CRM: Pt is requesting a callback regarding refused medication on 10/16/23  [phone] >> Nov 12, 2023  5:13 PM [place] J wrote: Patient needs an immediate update on medication (ALPRAZolam (XANAX) 1 MG tablet). Patient's mother just passed and patient is actively cleaning out mother's home. Medication is highly needed for her unexpected anxiety attacks. Patient was told there would be an update today but a callback was never received. Please call patient back with ANY update on this situation.

## 2023-11-18 ENCOUNTER — Ambulatory Visit (HOSPITAL_COMMUNITY): Payer: Medicare Other | Admitting: Clinical

## 2023-11-25 ENCOUNTER — Ambulatory Visit (INDEPENDENT_AMBULATORY_CARE_PROVIDER_SITE_OTHER): Payer: Medicare Other | Admitting: Clinical

## 2023-11-25 ENCOUNTER — Encounter (HOSPITAL_COMMUNITY): Payer: Self-pay | Admitting: Clinical

## 2023-11-25 DIAGNOSIS — F330 Major depressive disorder, recurrent, mild: Secondary | ICD-10-CM

## 2023-11-25 DIAGNOSIS — F419 Anxiety disorder, unspecified: Secondary | ICD-10-CM | POA: Diagnosis not present
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
## 2023-12-02 ENCOUNTER — Ambulatory Visit (INDEPENDENT_AMBULATORY_CARE_PROVIDER_SITE_OTHER): Payer: Medicare Other | Admitting: Clinical

## 2023-12-02 ENCOUNTER — Encounter (HOSPITAL_COMMUNITY): Payer: Self-pay | Admitting: Clinical

## 2023-12-02 DIAGNOSIS — Z634 Disappearance and death of family member: Secondary | ICD-10-CM

## 2023-12-02 DIAGNOSIS — F330 Major depressive disorder, recurrent, mild: Secondary | ICD-10-CM

## 2023-12-02 DIAGNOSIS — F419 Anxiety disorder, unspecified: Secondary | ICD-10-CM | POA: Diagnosis not present
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
## 2023-12-09 ENCOUNTER — Ambulatory Visit (HOSPITAL_COMMUNITY): Payer: Medicare Other | Admitting: Clinical

## 2023-12-16 ENCOUNTER — Encounter (HOSPITAL_COMMUNITY): Payer: Self-pay | Admitting: Clinical

## 2023-12-16 ENCOUNTER — Ambulatory Visit (HOSPITAL_COMMUNITY): Payer: Medicare Other | Admitting: Clinical

## 2023-12-16 DIAGNOSIS — T7431XD Adult psychological abuse, confirmed, subsequent encounter: Secondary | ICD-10-CM

## 2023-12-16 DIAGNOSIS — Z634 Disappearance and death of family member: Secondary | ICD-10-CM

## 2023-12-16 DIAGNOSIS — F419 Anxiety disorder, unspecified: Secondary | ICD-10-CM | POA: Diagnosis not present

## 2023-12-16 DIAGNOSIS — F330 Major depressive disorder, recurrent, mild: Secondary | ICD-10-CM
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
## 2023-12-23 ENCOUNTER — Ambulatory Visit (INDEPENDENT_AMBULATORY_CARE_PROVIDER_SITE_OTHER): Payer: Medicare Other | Admitting: Clinical

## 2023-12-23 ENCOUNTER — Encounter (HOSPITAL_COMMUNITY): Payer: Self-pay | Admitting: Clinical

## 2023-12-23 DIAGNOSIS — F330 Major depressive disorder, recurrent, mild: Secondary | ICD-10-CM

## 2023-12-23 DIAGNOSIS — Z634 Disappearance and death of family member: Secondary | ICD-10-CM

## 2023-12-23 DIAGNOSIS — Z630 Problems in relationship with spouse or partner: Secondary | ICD-10-CM | POA: Diagnosis not present

## 2023-12-23 DIAGNOSIS — F419 Anxiety disorder, unspecified: Secondary | ICD-10-CM | POA: Diagnosis not present
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
## 2023-12-30 ENCOUNTER — Ambulatory Visit (INDEPENDENT_AMBULATORY_CARE_PROVIDER_SITE_OTHER): Payer: Medicare Other | Admitting: Clinical

## 2023-12-30 ENCOUNTER — Encounter (HOSPITAL_COMMUNITY): Payer: Self-pay | Admitting: Clinical

## 2023-12-30 DIAGNOSIS — F330 Major depressive disorder, recurrent, mild: Secondary | ICD-10-CM

## 2023-12-30 DIAGNOSIS — F419 Anxiety disorder, unspecified: Secondary | ICD-10-CM | POA: Diagnosis not present

## 2023-12-30 DIAGNOSIS — Z634 Disappearance and death of family member: Secondary | ICD-10-CM | POA: Diagnosis not present

## 2023-12-30 DIAGNOSIS — Z630 Problems in relationship with spouse or partner: Secondary | ICD-10-CM
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
## 2024-01-06 ENCOUNTER — Encounter (HOSPITAL_COMMUNITY): Payer: Self-pay | Admitting: Clinical

## 2024-01-06 ENCOUNTER — Ambulatory Visit (INDEPENDENT_AMBULATORY_CARE_PROVIDER_SITE_OTHER): Payer: Medicare Other | Admitting: Clinical

## 2024-01-06 DIAGNOSIS — F330 Major depressive disorder, recurrent, mild: Secondary | ICD-10-CM | POA: Diagnosis not present

## 2024-01-06 DIAGNOSIS — Z630 Problems in relationship with spouse or partner: Secondary | ICD-10-CM

## 2024-01-06 DIAGNOSIS — Z634 Disappearance and death of family member: Secondary | ICD-10-CM

## 2024-01-06 DIAGNOSIS — F419 Anxiety disorder, unspecified: Secondary | ICD-10-CM
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
## 2024-01-13 ENCOUNTER — Ambulatory Visit (HOSPITAL_COMMUNITY): Payer: Medicare Other | Admitting: Clinical

## 2024-01-13 ENCOUNTER — Encounter (HOSPITAL_COMMUNITY): Payer: Self-pay | Admitting: Clinical

## 2024-01-13 DIAGNOSIS — H31002 Unspecified chorioretinal scars, left eye: Secondary | ICD-10-CM | POA: Diagnosis not present

## 2024-01-13 DIAGNOSIS — H2513 Age-related nuclear cataract, bilateral: Secondary | ICD-10-CM | POA: Diagnosis not present

## 2024-01-13 DIAGNOSIS — F419 Anxiety disorder, unspecified: Secondary | ICD-10-CM

## 2024-01-13 DIAGNOSIS — F330 Major depressive disorder, recurrent, mild: Secondary | ICD-10-CM | POA: Diagnosis not present

## 2024-01-13 DIAGNOSIS — Z630 Problems in relationship with spouse or partner: Secondary | ICD-10-CM | POA: Diagnosis not present

## 2024-01-13 DIAGNOSIS — Z634 Disappearance and death of family member: Secondary | ICD-10-CM
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
## 2024-01-15 DIAGNOSIS — M25511 Pain in right shoulder: Secondary | ICD-10-CM | POA: Diagnosis not present

## 2024-01-15 DIAGNOSIS — M542 Cervicalgia: Secondary | ICD-10-CM | POA: Diagnosis not present

## 2024-01-20 ENCOUNTER — Ambulatory Visit (INDEPENDENT_AMBULATORY_CARE_PROVIDER_SITE_OTHER): Payer: Medicare Other | Admitting: Clinical

## 2024-01-20 ENCOUNTER — Encounter (HOSPITAL_COMMUNITY): Payer: Self-pay | Admitting: Clinical

## 2024-01-20 DIAGNOSIS — Z634 Disappearance and death of family member: Secondary | ICD-10-CM | POA: Diagnosis not present

## 2024-01-20 DIAGNOSIS — F330 Major depressive disorder, recurrent, mild: Secondary | ICD-10-CM

## 2024-01-20 DIAGNOSIS — F419 Anxiety disorder, unspecified: Secondary | ICD-10-CM

## 2024-01-20 DIAGNOSIS — Z630 Problems in relationship with spouse or partner: Secondary | ICD-10-CM
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
## 2024-01-27 ENCOUNTER — Ambulatory Visit (HOSPITAL_COMMUNITY): Payer: Medicare Other | Admitting: Clinical

## 2024-01-28 ENCOUNTER — Other Ambulatory Visit: Payer: Self-pay | Admitting: Adult Health

## 2024-01-28 DIAGNOSIS — Z1231 Encounter for screening mammogram for malignant neoplasm of breast: Secondary | ICD-10-CM

## 2024-02-03 ENCOUNTER — Ambulatory Visit (HOSPITAL_COMMUNITY): Payer: Medicare Other | Admitting: Clinical

## 2024-02-10 ENCOUNTER — Ambulatory Visit (INDEPENDENT_AMBULATORY_CARE_PROVIDER_SITE_OTHER): Payer: Medicare Other | Admitting: Clinical

## 2024-02-10 ENCOUNTER — Encounter (HOSPITAL_COMMUNITY): Payer: Self-pay | Admitting: Clinical

## 2024-02-10 DIAGNOSIS — Z630 Problems in relationship with spouse or partner: Secondary | ICD-10-CM

## 2024-02-10 DIAGNOSIS — F330 Major depressive disorder, recurrent, mild: Secondary | ICD-10-CM

## 2024-02-10 DIAGNOSIS — Z634 Disappearance and death of family member: Secondary | ICD-10-CM

## 2024-02-10 DIAGNOSIS — F419 Anxiety disorder, unspecified: Secondary | ICD-10-CM
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
## 2024-02-11 ENCOUNTER — Ambulatory Visit
Admission: RE | Admit: 2024-02-11 | Discharge: 2024-02-11 | Disposition: A | Discharge status: Home / Self Care | Source: Ambulatory Visit | Attending: Adult Health | Admitting: Adult Health

## 2024-02-11 DIAGNOSIS — Z1231 Encounter for screening mammogram for malignant neoplasm of breast: Secondary | ICD-10-CM | POA: Diagnosis not present

## 2024-02-12 ENCOUNTER — Other Ambulatory Visit: Payer: Self-pay | Admitting: Adult Health

## 2024-02-12 DIAGNOSIS — F419 Anxiety disorder, unspecified: Secondary | ICD-10-CM

## 2024-02-12 NOTE — Telephone Encounter (Signed)
 Copied from CRM [id number]. Topic: Clinical - Medication Refill >> Feb 12, 2024  4:45 PM Alpha Arts wrote: Medication: ALPRAZolam  (XANAX ) 1 MG tablet   Has the patient contacted their pharmacy? Yes (Agent: If no, request that the patient contact the pharmacy for the refill. If patient does not wish to contact the pharmacy document the reason why and proceed with request.) (Agent: If yes, when and what did the pharmacy advise?)  This is the patient's preferred pharmacy:  [id number] Jonette Nestle, [place] - [address] AT [facility] [address] [facility] [address] [place] [address] Phone: [phone] Fax: [phone]  Is this the correct pharmacy for this prescription? Yes If no, delete pharmacy and type the correct one.   Has the prescription been filled recently? Yes  Is the patient out of the medication? Yes  Has the patient been seen for an appointment in the last year OR does the patient have an upcoming appointment? Yes  Can we respond through MyChart? No Call [phone]  Agent: Please be advised that Rx refills may take up to 3 business days. We ask that you follow-up with your pharmacy.

## 2024-02-15 ENCOUNTER — Ambulatory Visit: Payer: Self-pay

## 2024-02-15 ENCOUNTER — Telehealth: Payer: Self-pay

## 2024-02-15 NOTE — Telephone Encounter (Signed)
 Copied from CRM [id number]. Topic: Clinical - Medication Question >> Feb 15, 2024 10:27 AM Melissa Holmes wrote: Reason for CRM: Patient would like to know if another provider may approve of refill request for prescription ALPRAZolam  (XANAX ) 1 MG tablet & also would like to have a nurse give a call back. Please call [phone]

## 2024-02-15 NOTE — Telephone Encounter (Signed)
 FYI Only or Action Required?: Action required by provider  Patient was last seen in primary care on 07/21/2023 by Alto Atta, NP. Called Nurse Triage reporting worsening anxiety due to being out of Xanax . Refill requesting on Friday. Symptoms began several days ago. Interventions attempted: Nothing. Symptoms are: gradually worsening.  Triage Disposition: Call PCP Within 24 Hours  Patient/caregiver understands and will follow disposition?: No, wishes to speak with PCP      Copied from CRM [id number]. Topic: Clinical - Red Word Triage >> Feb 15, 2024  4:27 PM Fonda T wrote: Red Word that prompted transfer to Nurse Triage: Patient calling, states she has been out of her medication for depression for several days, and she is feeling symptoms returning. Patient is requesting to speak to a nurse further, no further information was given. Additional Information  Commented on: MILD anxiety symptoms (e.g., Anxiety symptoms are mild and intermittent; symptoms do not interfere with daily activities)    Patient requesting Xanax  refill asap  Answer Assessment - Initial Assessment Questions 1. CONCERN: "Did anything happen that prompted you to call today?"      Patient would like xanax  refill asap 4. SEVERITY: "How would you rate the level of anxiety?" (e.g., 0 - 10; or mild, moderate, severe).     Patient states she feels she could punch a punching bag right now. Or she may cry in two seconds. 5. FUNCTIONAL IMPAIRMENT: "How have these feelings affected your ability to do daily activities?" "Have you had more difficulty than usual doing your normal daily activities?" (e.g., getting better, same, worse; self-care, school, work, interactions)     Patient states she will have unable to  7. RISK OF HARM - SUICIDAL IDEATION: "Do you ever have thoughts of hurting or killing yourself?" If Yes, ask:  "Do you have these feelings now?" "Do you have a plan on how you would do this?"     No - patient states she  will not harm herself 8. TREATMENT:  "What has been done so far to treat this anxiety?" (e.g., medicines, relaxation strategies). "What has helped?"     Patient  10. POTENTIAL TRIGGERS: "Do you drink caffeinated beverages (e.g., coffee, colas, teas), and how much daily?" "Do you drink alcohol or use any drugs?" "Have you started any new medicines recently?"       Patient's parents have both passed away - patient's husband is ill 12. OTHER SYMPTOMS: "Do you have any other symptoms?" (e.g., feeling depressed, trouble concentrating, trouble sleeping, trouble breathing, palpitations or fast heartbeat, chest pain, sweating, nausea, or diarrhea)       "Feeling like she wants to punch a punching bag    This RN attempted to contact [facility] to determine if patient could have assistance with refill of Xanax . Alto Atta is off work today and will return tomorrow. Tomorrow will be day 3 of refill request.  Protocols used: Anxiety and Panic Attack-A-AH

## 2024-02-15 NOTE — Telephone Encounter (Signed)
 E2c2 agent will let pt know cory is not in office will be back tomorrow

## 2024-02-16 ENCOUNTER — Other Ambulatory Visit: Payer: Medicare Other

## 2024-02-16 ENCOUNTER — Ambulatory Visit: Payer: Medicare Other | Admitting: Gynecologic Oncology

## 2024-02-16 MED ORDER — ALPRAZOLAM 1 MG PO TABS: ORAL_TABLET | ORAL | 2 refills | Status: DC

## 2024-02-16 NOTE — Telephone Encounter (Signed)
This has been taking care of.

## 2024-02-17 ENCOUNTER — Ambulatory Visit (INDEPENDENT_AMBULATORY_CARE_PROVIDER_SITE_OTHER): Payer: Medicare Other | Admitting: Clinical

## 2024-02-17 ENCOUNTER — Encounter (HOSPITAL_COMMUNITY): Payer: Self-pay | Admitting: Clinical

## 2024-02-17 DIAGNOSIS — F419 Anxiety disorder, unspecified: Secondary | ICD-10-CM

## 2024-02-17 DIAGNOSIS — Z634 Disappearance and death of family member: Secondary | ICD-10-CM | POA: Diagnosis not present

## 2024-02-17 DIAGNOSIS — Z630 Problems in relationship with spouse or partner: Secondary | ICD-10-CM

## 2024-02-17 DIAGNOSIS — F330 Major depressive disorder, recurrent, mild: Secondary | ICD-10-CM | POA: Diagnosis not present

## 2024-02-17 DIAGNOSIS — T7431XD Adult psychological abuse, confirmed, subsequent encounter: Secondary | ICD-10-CM
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
## 2024-02-24 ENCOUNTER — Encounter (HOSPITAL_COMMUNITY): Payer: Self-pay | Admitting: Clinical

## 2024-02-24 ENCOUNTER — Ambulatory Visit (INDEPENDENT_AMBULATORY_CARE_PROVIDER_SITE_OTHER): Payer: Medicare Other | Admitting: Clinical

## 2024-02-24 DIAGNOSIS — F419 Anxiety disorder, unspecified: Secondary | ICD-10-CM | POA: Diagnosis not present

## 2024-02-24 DIAGNOSIS — Z634 Disappearance and death of family member: Secondary | ICD-10-CM | POA: Diagnosis not present

## 2024-02-24 DIAGNOSIS — F330 Major depressive disorder, recurrent, mild: Secondary | ICD-10-CM | POA: Diagnosis not present

## 2024-02-24 DIAGNOSIS — T7431XD Adult psychological abuse, confirmed, subsequent encounter: Secondary | ICD-10-CM | POA: Diagnosis not present
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
## 2024-03-02 ENCOUNTER — Ambulatory Visit (INDEPENDENT_AMBULATORY_CARE_PROVIDER_SITE_OTHER): Payer: Medicare Other | Admitting: Clinical

## 2024-03-02 ENCOUNTER — Encounter (HOSPITAL_COMMUNITY): Payer: Self-pay | Admitting: Clinical

## 2024-03-02 DIAGNOSIS — T7431XD Adult psychological abuse, confirmed, subsequent encounter: Secondary | ICD-10-CM

## 2024-03-02 DIAGNOSIS — F331 Major depressive disorder, recurrent, moderate: Secondary | ICD-10-CM

## 2024-03-02 DIAGNOSIS — Z634 Disappearance and death of family member: Secondary | ICD-10-CM
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
## 2024-03-09 ENCOUNTER — Encounter (HOSPITAL_COMMUNITY): Payer: Self-pay | Admitting: Clinical

## 2024-03-09 ENCOUNTER — Ambulatory Visit (INDEPENDENT_AMBULATORY_CARE_PROVIDER_SITE_OTHER): Payer: Medicare Other | Admitting: Clinical

## 2024-03-09 DIAGNOSIS — T7431XD Adult psychological abuse, confirmed, subsequent encounter: Secondary | ICD-10-CM

## 2024-03-09 DIAGNOSIS — F419 Anxiety disorder, unspecified: Secondary | ICD-10-CM

## 2024-03-09 DIAGNOSIS — F331 Major depressive disorder, recurrent, moderate: Secondary | ICD-10-CM | POA: Diagnosis not present

## 2024-03-09 DIAGNOSIS — Z634 Disappearance and death of family member: Secondary | ICD-10-CM | POA: Diagnosis not present
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
## 2024-03-16 ENCOUNTER — Encounter (HOSPITAL_COMMUNITY): Payer: Self-pay | Admitting: Clinical

## 2024-03-16 ENCOUNTER — Ambulatory Visit (INDEPENDENT_AMBULATORY_CARE_PROVIDER_SITE_OTHER): Payer: Medicare Other | Admitting: Clinical

## 2024-03-16 DIAGNOSIS — T7431XD Adult psychological abuse, confirmed, subsequent encounter: Secondary | ICD-10-CM | POA: Diagnosis not present

## 2024-03-16 DIAGNOSIS — F419 Anxiety disorder, unspecified: Secondary | ICD-10-CM | POA: Diagnosis not present

## 2024-03-16 DIAGNOSIS — Z634 Disappearance and death of family member: Secondary | ICD-10-CM | POA: Diagnosis not present

## 2024-03-16 DIAGNOSIS — F331 Major depressive disorder, recurrent, moderate: Secondary | ICD-10-CM
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
## 2024-03-23 ENCOUNTER — Encounter (HOSPITAL_COMMUNITY): Payer: Self-pay | Admitting: Clinical

## 2024-03-23 ENCOUNTER — Ambulatory Visit (INDEPENDENT_AMBULATORY_CARE_PROVIDER_SITE_OTHER): Admitting: Clinical

## 2024-03-23 DIAGNOSIS — T7431XD Adult psychological abuse, confirmed, subsequent encounter: Secondary | ICD-10-CM

## 2024-03-23 DIAGNOSIS — F331 Major depressive disorder, recurrent, moderate: Secondary | ICD-10-CM

## 2024-03-23 DIAGNOSIS — F419 Anxiety disorder, unspecified: Secondary | ICD-10-CM

## 2024-03-23 DIAGNOSIS — Z634 Disappearance and death of family member: Secondary | ICD-10-CM
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
## 2024-03-25 ENCOUNTER — Telehealth (HOSPITAL_COMMUNITY): Payer: Self-pay | Admitting: Clinical
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
## 2024-03-30 ENCOUNTER — Encounter (HOSPITAL_COMMUNITY): Payer: Self-pay | Admitting: Clinical

## 2024-03-30 ENCOUNTER — Ambulatory Visit (INDEPENDENT_AMBULATORY_CARE_PROVIDER_SITE_OTHER): Admitting: Clinical

## 2024-03-30 DIAGNOSIS — Z634 Disappearance and death of family member: Secondary | ICD-10-CM | POA: Diagnosis not present

## 2024-03-30 DIAGNOSIS — T7431XD Adult psychological abuse, confirmed, subsequent encounter: Secondary | ICD-10-CM | POA: Diagnosis not present

## 2024-03-30 DIAGNOSIS — F419 Anxiety disorder, unspecified: Secondary | ICD-10-CM

## 2024-03-30 DIAGNOSIS — F331 Major depressive disorder, recurrent, moderate: Secondary | ICD-10-CM | POA: Diagnosis not present
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
## 2024-04-06 ENCOUNTER — Encounter (HOSPITAL_COMMUNITY): Payer: Self-pay | Admitting: Clinical

## 2024-04-06 ENCOUNTER — Ambulatory Visit (INDEPENDENT_AMBULATORY_CARE_PROVIDER_SITE_OTHER): Admitting: Clinical

## 2024-04-06 DIAGNOSIS — F331 Major depressive disorder, recurrent, moderate: Secondary | ICD-10-CM

## 2024-04-06 DIAGNOSIS — Z634 Disappearance and death of family member: Secondary | ICD-10-CM

## 2024-04-06 DIAGNOSIS — T7431XD Adult psychological abuse, confirmed, subsequent encounter: Secondary | ICD-10-CM

## 2024-04-06 DIAGNOSIS — F419 Anxiety disorder, unspecified: Secondary | ICD-10-CM | POA: Diagnosis not present
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
## 2024-04-13 ENCOUNTER — Encounter (HOSPITAL_COMMUNITY): Payer: Self-pay | Admitting: Clinical

## 2024-04-13 ENCOUNTER — Ambulatory Visit (HOSPITAL_COMMUNITY): Admitting: Clinical

## 2024-04-13 DIAGNOSIS — F419 Anxiety disorder, unspecified: Secondary | ICD-10-CM | POA: Diagnosis not present

## 2024-04-13 DIAGNOSIS — F331 Major depressive disorder, recurrent, moderate: Secondary | ICD-10-CM

## 2024-04-13 DIAGNOSIS — T7431XD Adult psychological abuse, confirmed, subsequent encounter: Secondary | ICD-10-CM
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
## 2024-04-14 ENCOUNTER — Other Ambulatory Visit: Payer: Self-pay | Admitting: Adult Health

## 2024-04-14 DIAGNOSIS — F419 Anxiety disorder, unspecified: Secondary | ICD-10-CM

## 2024-04-14 MED ORDER — ALPRAZOLAM 1 MG PO TABS: ORAL_TABLET | ORAL | 2 refills | Status: DC

## 2024-04-14 NOTE — Telephone Encounter (Signed)
 Copied from CRM [id number]. Topic: Clinical - Medication Refill >> Apr 14, 2024  8:11 AM Mia F wrote: Medication: ALPRAZolam  (XANAX ) 1 MG tablet   ** PT IS ASKING FOR A LARGER QUANTITY AS SHE IS GOING OUT OF THE COUNTRY FROM [place] AND SHE NO LONGER WANTS IT AS NEEDED.**  Has the patient contacted their pharmacy? Yes (Agent: If no, request that the patient contact the pharmacy for the refill. If patient does not wish to contact the pharmacy document the reason why and proceed with request.) (Agent: If yes, when and what did the pharmacy advise?)  This is the patient's preferred pharmacy:  [id number] [address], [place] - [address] AT [facility] OF [address] [facility] [address] [place] [address] Phone: [phone] Fax: [phone]  Is this the correct pharmacy for this prescription? No If no, delete pharmacy and type the correct one.   Has the prescription been filled recently? Yes  Is the patient out of the medication? No  Has the patient been seen for an appointment in the last year OR does the patient have an upcoming appointment? Yes  Can we respond through MyChart? No  Agent: Please be advised that Rx refills may take up to 3 business days. We ask that you follow-up with your pharmacy.

## 2024-04-14 NOTE — Telephone Encounter (Signed)
 Okay for refill?

## 2024-04-14 NOTE — Telephone Encounter (Signed)
 Prescription printed today was filled by accident. Please disregard refill. Pt called and wanted to have her medication refilled before she goes on her trip. Pt is requesting enough for 45 days for her Sept. Refill due to going out of town. Pt is not sure her exact dates and will call us  back with the exact date.

## 2024-04-20 ENCOUNTER — Ambulatory Visit (HOSPITAL_COMMUNITY): Admitting: Clinical

## 2024-04-20 ENCOUNTER — Encounter (HOSPITAL_COMMUNITY): Payer: Self-pay | Admitting: Clinical

## 2024-04-20 DIAGNOSIS — F419 Anxiety disorder, unspecified: Secondary | ICD-10-CM | POA: Diagnosis not present

## 2024-04-20 DIAGNOSIS — T7431XD Adult psychological abuse, confirmed, subsequent encounter: Secondary | ICD-10-CM | POA: Diagnosis not present

## 2024-04-20 DIAGNOSIS — F331 Major depressive disorder, recurrent, moderate: Secondary | ICD-10-CM | POA: Diagnosis not present
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
## 2024-04-27 ENCOUNTER — Ambulatory Visit (HOSPITAL_COMMUNITY): Admitting: Clinical

## 2024-05-04 ENCOUNTER — Encounter (HOSPITAL_COMMUNITY): Payer: Self-pay | Admitting: Clinical

## 2024-05-04 ENCOUNTER — Ambulatory Visit (INDEPENDENT_AMBULATORY_CARE_PROVIDER_SITE_OTHER): Admitting: Clinical

## 2024-05-04 DIAGNOSIS — F419 Anxiety disorder, unspecified: Secondary | ICD-10-CM

## 2024-05-04 DIAGNOSIS — T7431XD Adult psychological abuse, confirmed, subsequent encounter: Secondary | ICD-10-CM

## 2024-05-04 DIAGNOSIS — F331 Major depressive disorder, recurrent, moderate: Secondary | ICD-10-CM
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
## 2024-05-11 ENCOUNTER — Ambulatory Visit (HOSPITAL_COMMUNITY): Admitting: Clinical

## 2024-05-16 ENCOUNTER — Other Ambulatory Visit: Payer: Self-pay | Admitting: Adult Health

## 2024-05-16 DIAGNOSIS — F419 Anxiety disorder, unspecified: Secondary | ICD-10-CM

## 2024-05-16 NOTE — Telephone Encounter (Signed)
 Medication: ALPRAZolam  (XANAX ) 1 MG tablet Patient is requesting a vacation override as she will leaving for 1 month and needs an extra quantity patient is asking for 44 tablets this month ; patient is requesting a call back regarding this request.

## 2024-05-16 NOTE — Telephone Encounter (Unsigned)
 Copied from CRM [id number]. Topic: Clinical - Medication Refill >> May 16, 2024  8:52 AM Berwyn MATSU wrote: Medication: ALPRAZolam  (XANAX ) 1 MG tablet Patient is requesting a vacation override as she will leaving for 1 month and needs an extra quantity patient is asking for 44 tablets this month ; patient is requesting a call back regarding this request.   Has the patient contacted their pharmacy? Yes (Agent: If no, request that the patient contact the pharmacy for the refill. If patient does not wish to contact the pharmacy document the reason why and proceed with request.) (Agent: If yes, when and what did the pharmacy advise?)  This is the patient's preferred pharmacy:  [id number] [address], [place] - [address] AT [facility] [address] [facility] [address] [place] [address] Phone: [phone] Fax: [phone]  Is this the correct pharmacy for this prescription? Yes If no, delete pharmacy and type the correct one.   Has the prescription been filled recently? Yes  Is the patient out of the medication? Yes  Has the patient been seen for an appointment in the last year OR does the patient have an upcoming appointment? Yes  Can we respond through MyChart? Yes  Agent: Please be advised that Rx refills may take up to 3 business days. We ask that you follow-up with your pharmacy.

## 2024-05-17 MED ORDER — ALPRAZOLAM 1 MG PO TABS: 1.0000 mg | ORAL_TABLET | Freq: Two times a day (BID) | ORAL | 0 refills | Status: DC | PRN

## 2024-05-17 NOTE — Telephone Encounter (Signed)
 Okay for refill?

## 2024-05-18 ENCOUNTER — Encounter (HOSPITAL_COMMUNITY): Payer: Self-pay | Admitting: Clinical

## 2024-05-18 ENCOUNTER — Ambulatory Visit (HOSPITAL_COMMUNITY): Admitting: Clinical

## 2024-05-18 DIAGNOSIS — F330 Major depressive disorder, recurrent, mild: Secondary | ICD-10-CM

## 2024-05-18 DIAGNOSIS — T7431XD Adult psychological abuse, confirmed, subsequent encounter: Secondary | ICD-10-CM

## 2024-05-18 DIAGNOSIS — F419 Anxiety disorder, unspecified: Secondary | ICD-10-CM
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
## 2024-05-25 ENCOUNTER — Encounter (HOSPITAL_COMMUNITY): Payer: Self-pay | Admitting: Clinical

## 2024-05-25 ENCOUNTER — Ambulatory Visit (INDEPENDENT_AMBULATORY_CARE_PROVIDER_SITE_OTHER): Admitting: Clinical

## 2024-05-25 DIAGNOSIS — T7431XD Adult psychological abuse, confirmed, subsequent encounter: Secondary | ICD-10-CM | POA: Diagnosis not present

## 2024-05-25 DIAGNOSIS — F419 Anxiety disorder, unspecified: Secondary | ICD-10-CM

## 2024-05-25 DIAGNOSIS — F330 Major depressive disorder, recurrent, mild: Secondary | ICD-10-CM
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
## 2024-06-01 ENCOUNTER — Ambulatory Visit (HOSPITAL_COMMUNITY): Admitting: Clinical

## 2024-06-08 ENCOUNTER — Ambulatory Visit (HOSPITAL_COMMUNITY): Admitting: Clinical

## 2024-06-15 ENCOUNTER — Ambulatory Visit (HOSPITAL_COMMUNITY): Admitting: Clinical

## 2024-06-22 ENCOUNTER — Ambulatory Visit (HOSPITAL_COMMUNITY): Admitting: Clinical

## 2024-06-29 ENCOUNTER — Ambulatory Visit (HOSPITAL_COMMUNITY): Admitting: Clinical

## 2024-06-29 ENCOUNTER — Encounter (HOSPITAL_COMMUNITY): Payer: Self-pay | Admitting: Clinical

## 2024-06-29 DIAGNOSIS — Z630 Problems in relationship with spouse or partner: Secondary | ICD-10-CM | POA: Diagnosis not present

## 2024-06-29 DIAGNOSIS — F330 Major depressive disorder, recurrent, mild: Secondary | ICD-10-CM

## 2024-06-29 DIAGNOSIS — F419 Anxiety disorder, unspecified: Secondary | ICD-10-CM

## 2024-06-29 NOTE — Progress Notes (Unsigned)
 THERAPIST PROGRESS NOTE  Session Time: 11:09am-12:06pm  Session #60  Participation Level: Active  Behavioral Response: Casual Alert Euthymic  Type of Therapy: Individual Therapy  Treatment Goals addressed:  LTG: Melissa Holmes will be able to identify 5-7 cognitive distortions she has and will learn how to come up with replacement thoughts that are more balanced, realistic, and helpful.  STG: Melissa Holmes will learn methods of remaining detached from her loved ones so that she does not become depressed about the circumstances of their lives that they are choosing  STG: Melissa Melissa Holmes will identify situations, thoughts, and feelings that trigger internal anger, and/or angry/aggressive actions as evidenced by self-report  LTG: Melissa Holmes will become aware of the feelings underlying her anger, specifically regarding husband's drinking and work to resolve those underlying emotions in order to reduce her anger  STG: Melissa Holmes will work on detachment from her husband's drinking in order to be healthier emotionally, physically, and spiritually herself. LTG: Process life events to the extent needed so that will be able to move forward with various areas of life in a better frame of mind per self-report.   STG: Address and process recent bereavement, feelings, coping skills, supports, meaning, closure and grief-related issues.   LTG: Score less than 9 on the PHQ-9 and less than 5 on the GAD-7 as evidenced by intermittent administration of the questionnaires to determine progress in managing depression and anxiety. STG: Work to Arts development officer from models like CBT, Stages of Change, DBT, shame resilience theory, ACT, SFBT, MI, trauma-informed therapy and others to be able to manage mental health symptoms, AEB practicing out of session and reporting back.  LTG: Learn and practice communication techniques such as active listening, I statements, open-ended questions, fair fighting rules, initiating conversations;  learn  about boundary types and how to implement/enforce them AEB self-report of use of same.  STG: Learn a variety of breathing techniques and grounding strategies, practice in session then report independent application out of session.  LTG: Explore personal core beliefs, rules and assumptions, and cognitive distortions through therapist using Cognitive Behavioral Therapy; learn about Behavioral Activation and Acting As If.  STG: Learn emotion regulation strategies, distress tolerance skills, interpersonal effectiveness techniques, and mindfulness practices and use them in session and in life situations to improve results and satisfaction  ProgressTowards Goals: Progressing  Interventions: Solution Focused, Supportive, and Other: detachment vs enabling   Summary: Melissa Holmes is a 68 y.o. female who presents with increased anxiety and anger with her partner who is an alcoholic, wanting to learn how to better manage her emotions and thoughts.  She presented oriented x5 and stated ***.  CSW evaluated patient's medication compliance, use of coping tools, and self-care, as applicable.  She provided an update on various aspects of her life that are normally discussed in therapy, including ***   Suicidal/Homicidal: No without intent/plan  Therapist Response:  Patient is progressing AEB engaging in scheduled therapy session.  Throughout the session, CSW gave patient the opportunity to explore thoughts and feelings associated with current life situations and past/present stressors.   CSW challenged patient gently and appropriately to consider different ways of looking at reported issues. CSW encouraged patient's expression of feelings and validated these using empathy, active listening, open body language, and unconditional positive regard.     Plan/Recommendations:  Return to therapy in 1 week to next scheduled appointment on 10/29, reflect on what was discussed in session, engage in self care behaviors as  explored in session, do homework as assigned (  settle in at home without going back to old routine, but keep implementing newly-made decisions about distancing herself), and return to next session prepared to talk about experience with new coping methods.   Diagnosis:  Mild episode of recurrent depressive disorder  Anxiety disorder, unspecified type  Relationship problem between partners  Collaboration of Care: Patient refused AEB - not interested in medicines    Patient/Guardian was advised Release of Information must be obtained prior to any record release in order to collaborate their care with an outside provider. Patient/Guardian was advised if they have not already done so to contact the registration department to sign all necessary forms in order for us  to release information regarding their care.   Consent: Patient/Guardian gives verbal consent for treatment and assignment of benefits for services provided during this visit. Patient/Guardian expressed understanding and agreed to proceed.   Elgie JINNY Crest, LCSW 06/25/2023

## 2024-07-04 ENCOUNTER — Other Ambulatory Visit: Payer: Self-pay | Admitting: Adult Health

## 2024-07-04 DIAGNOSIS — F419 Anxiety disorder, unspecified: Secondary | ICD-10-CM

## 2024-07-06 ENCOUNTER — Ambulatory Visit (INDEPENDENT_AMBULATORY_CARE_PROVIDER_SITE_OTHER): Admitting: Clinical

## 2024-07-06 ENCOUNTER — Encounter (HOSPITAL_COMMUNITY): Payer: Self-pay | Admitting: Clinical

## 2024-07-06 DIAGNOSIS — F419 Anxiety disorder, unspecified: Secondary | ICD-10-CM | POA: Diagnosis not present

## 2024-07-06 DIAGNOSIS — F330 Major depressive disorder, recurrent, mild: Secondary | ICD-10-CM | POA: Diagnosis not present

## 2024-07-06 NOTE — Telephone Encounter (Unsigned)
 Copied from CRM [id number]. Topic: Clinical - Medication Refill >> Jul 04, 2024  3:41 PM Anairis L wrote: Medication: ALPRAZolam  (XANAX ) 1 MG tablet    Has the patient contacted their pharmacy? Yes (Agent: If no, request that the patient contact the pharmacy for the refill. If patient does not wish to contact the pharmacy document the reason why and proceed with request.) (Agent: If yes, when and what did the pharmacy advise?)  This is the patient's preferred pharmacy:  [id number] [address], [place] - [address] AT [facility] OF [address] & [facility] [address] [place] [address] Phone: [phone] Fax: [phone]  Is this the correct pharmacy for this prescription? Yes If no, delete pharmacy and type the correct one.   Has the prescription been filled recently? Yes  Is the patient out of the medication? Yes  Has the patient been seen for an appointment in the last year OR does the patient have an upcoming appointment? Yes  Can we respond through MyChart? Yes  Agent: Please be advised that Rx refills may take up to 3 business days. We ask that you follow-up with your pharmacy. >> Jul 06, 2024  1:17 PM Mia F wrote: Pt is calling to check the status of her medication refill request. Pt says today makes 3 days but meds were requested on 10/27. Pt was advised tro allow up to 3 business days. She says she is all out of medicine

## 2024-07-06 NOTE — Progress Notes (Signed)
 THERAPIST PROGRESS NOTE  Session Time: 11:04am-12:04pm  Session #61  Participation Level: Active  Behavioral Response: Casual Alert Euthymic  Type of Therapy: Individual Therapy  Treatment Goals addressed:  LTG: Melissa Holmes will be able to identify 5-7 cognitive distortions she has and will learn how to come up with replacement thoughts that are more balanced, realistic, and helpful.  STG: Melissa Holmes will learn methods of remaining detached from her loved ones so that she does not become depressed about the circumstances of their lives that they are choosing  STG: Melissa Holmes will identify situations, thoughts, and feelings that trigger internal anger, and/or angry/aggressive actions as evidenced by self-report  LTG: Melissa Holmes will become aware of the feelings underlying her anger, specifically regarding husband's drinking and work to resolve those underlying emotions in order to reduce her anger  STG: Melissa Holmes will work on detachment from her husband's drinking in order to be healthier emotionally, physically, and spiritually herself. LTG: Process life events to the extent needed so that will be able to move forward with various areas of life in a better frame of mind per self-report.   STG: Address and process recent bereavement, feelings, coping skills, supports, meaning, closure and grief-related issues.   LTG: Score less than 9 on the PHQ-9 and less than 5 on the GAD-7 as evidenced by intermittent administration of the questionnaires to determine progress in managing depression and anxiety. STG: Work to arts development officer from models like CBT, Stages of Change, DBT, shame resilience theory, ACT, SFBT, MI, trauma-informed therapy and others to be able to manage mental health symptoms, AEB practicing out of session and reporting back.  LTG: Learn and practice communication techniques such as active listening, I statements, open-ended questions, fair fighting rules, initiating conversations;  learn  about boundary types and how to implement/enforce them AEB self-report of use of same.  STG: Learn a variety of breathing techniques and grounding strategies, practice in session then report independent application out of session.  LTG: Explore personal core beliefs, rules and assumptions, and cognitive distortions through therapist using Cognitive Behavioral Therapy; learn about Behavioral Activation and Acting As If.  STG: Learn emotion regulation strategies, distress tolerance skills, interpersonal effectiveness techniques, and mindfulness practices and use them in session and in life situations to improve results and satisfaction  ProgressTowards Goals: Progressing  Interventions: Supportive and Other: detachment and grief   Summary: Melissa Holmes is a 68 y.o. female who presents with increased anxiety and anger with her partner who is an alcoholic, wanting to learn how to better manage her emotions and thoughts.  She presented oriented x5 and shared a lot about her month-long trip in [place].  CSW evaluated patient's use of coping tools and self-care.  She provided an update on various aspects of her life that are normally discussed in therapy, including her anger at husband's refusal to wear his hearing aids, solutions she came up with on her own that calmed her down, her search for an apartment to move in to, decision she has made not to take a trip for 10 days so soon after her long trip to [place], and surging grief over mother during and after the trip to [place].  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  Nonetheless she missed her mother a lot during her [place] trip because she normally has shared trips with her  mom in the past.  We discussed the way in which anniversaries are particularly difficult and she is coming up on the 1-year anniversary.  She remembered that in one of our first therapy sessions, CSW told her that even though husband was plainly sick with alcoholism, she was also sick in her connection to him.  She stated this was not offensive to her, but that she is only now starting to comprehend this.  She believes she has really mastered detachment, saying I've seen the light.  The trip involved a lot of testing the waters about whether he can live without her and even though he is very bruised and sore all over from various falls, she believes if she needs to move out for her own sanity, it will work out fine.   Suicidal/Homicidal: No without intent/plan  Therapist Response:  Patient is progressing AEB engaging in scheduled therapy session.  Throughout the session, CSW gave patient the opportunity to explore thoughts and feelings associated with current life situations and past/present stressors.   CSW challenged patient gently and appropriately to consider different ways of looking at reported issues. CSW encouraged patient's expression of feelings and validated these using empathy, active listening, open body language, and unconditional positive regard.     Plan/Recommendations:  Return to therapy in 1 week to next scheduled appointment on 10/29, reflect on what was discussed in session, engage in self care behaviors as explored in session, do homework as assigned (settle in at home without going back to old routine, but keep implementing newly-made decisions about distancing herself), and return to next session prepared to talk about experience with new coping methods.   Diagnosis:  Mild episode of recurrent depressive disorder  Anxiety disorder, unspecified type  Collaboration of Care: Patient refused AEB - not interested in medicines    Patient/Guardian was advised Release of  Information must be obtained prior to any record release in order to collaborate their care with an outside provider. Patient/Guardian was advised if they have not already done so to contact the registration department to sign all necessary forms in order for us  to release information regarding their care.   Consent: Patient/Guardian gives verbal consent for treatment and assignment of benefits for services provided during this visit. Patient/Guardian expressed understanding and agreed to proceed.   Elgie JINNY Crest, LCSW 06/25/2023

## 2024-07-07 ENCOUNTER — Encounter: Payer: Self-pay | Admitting: Family Medicine

## 2024-07-07 ENCOUNTER — Ambulatory Visit: Admitting: Family Medicine

## 2024-07-07 DIAGNOSIS — Z0000 Encounter for general adult medical examination without abnormal findings: Secondary | ICD-10-CM

## 2024-07-07 MED ORDER — ALPRAZOLAM 1 MG PO TABS: 1.0000 mg | ORAL_TABLET | Freq: Two times a day (BID) | ORAL | 0 refills | Status: DC | PRN

## 2024-07-07 NOTE — Progress Notes (Signed)
 PATIENT CHECK-IN and HEALTH RISK ASSESSMENT QUESTIONNAIRE:  -completed by phone/video for upcoming Medicare Preventive Visit   Pre-Visit Check-in: 1)Vitals (height, wt, BP, etc) - record in vitals section for visit on day of visit Request home vitals (wt, BP, etc.) and enter into vitals, THEN update Vital Signs SmartPhrase below at the top of the HPI. See below.  2)Review and Update Medications, Allergies PMH, Surgeries, Social history in Epic 3)Hospitalizations in the last year with date/reason? No  4)Review and Update Care Team (patient's specialists) in Epic 5) Complete PHQ9 in Epic  6) Complete Fall Screening in Epic 7)Review all Health Maintenance Due and order if not done.  Medicare Wellness Patient Questionnaire:  Answer theses question about your habits: How often do you have a drink containing alcohol? 4 time weekly  How many drinks containing alcohol do you have on a typical day when you are drinking? 1 How often do you have six or more drinks on one occasion? no Have you ever smoked? yes Quit date if applicable?  Sometimes smokes  How many packs a day do/did you smoke?  Less than a pack in 1 month Do you use smokeless tobacco? no Do you use an illicit drugs? no On average, how many days per week do you engage in moderate to strenuous exercise (like a brisk walk)? 4 days  On average, how many minutes do you engage in exercise at this level? 30 mins - mostly stretching and walking, some machines too at gym Are you sexually active? No  Typical breakfast: Coffee fruit cereal eggs muffin  Typical lunch: Varies  Typical dinner: Varies  Typical snacks: Charcuterie board   Beverages: water, lemon water, coffee  Answer theses question about your everyday activities: Can you perform most household chores? Yes  Are you deaf or have significant trouble hearing? no Do you feel that you have a problem with memory? no Do you feel safe at home? Yes  Last dentist visit? 3 months  ago 8. Do you have any difficulty performing your everyday activities? no Are you having any difficulty walking, taking medications on your own, and or difficulty managing daily home needs? no Do you have difficulty walking or climbing stairs? no Do you have difficulty dressing or bathing?no Do you have difficulty doing errands alone such as visiting a doctor's office or shopping?no Do you currently have any difficulty preparing food and eating?no Do you currently have any difficulty using the toilet?no Do you have any difficulty managing your finances?no Do you have any difficulties with housekeeping of managing your housekeeping?no   Do you have Advanced Directives in place (Living Will, Healthcare Power or Attorney)? Yes    Last eye Exam and location? [facility] ophthalmology 12/2023   Do you currently use prescribed or non-prescribed narcotic or opioid pain medications? no  Do you have a history or close family history of breast, ovarian, tubal or peritoneal cancer or a family member with BRCA (breast cancer susceptibility 1 and 2) gene mutations? Yes ovarian cancer    Nurse/Assistant Credentials/time stamp: Mg 10:25 AM     ----------------------------------------------------------------------------------------------------------------------------------------------------------------------------------------------------------------------  Because this visit was a virtual/telehealth visit, some criteria may be missing or patient reported. Any vitals not documented were not able to be obtained and vitals that have been documented are patient reported.    MEDICARE ANNUAL PREVENTIVE VISIT WITH PROVIDER: (Welcome to Medicare, initial annual wellness or annual wellness exam)  Virtual Visit via Video Note  I connected with [name] Douse on 07/07/24 by a video  enabled telemedicine application and verified that I am speaking with the correct person using two identifiers.  Location  patient: home Location provider:work or home office Persons participating in the virtual visit: patient, provider  Concerns and/or follow up today: doing well, traveled to [place] last month. Reports lost some weight when she stopped drink alcohol. Restarted alcohol on her travels and weight is stable but has not gone back to original weight. She see PCP for a physical in a few weeks.  She is wanting a status update on her alprazolam  refill.    See HM section in Epic for other details of completed HM.    ROS: negative for report of fevers, vision changes, vision loss, hearing loss or change, chest pain, sob, hemoptysis, melena, hematochezia, hematuria, falls, bleeding or bruising, thoughts of suicide or self harm, memory loss  Patient-completed extensive health risk assessment - reviewed and discussed with the patient: See Health Risk Assessment completed with patient prior to the visit either above or in recent phone note. This was reviewed in detailed with the patient today and appropriate recommendations, orders and referrals were placed as needed per Summary below and patient instructions.   Review of Medical History: -PMH, PSH, Family History and current specialty and care providers reviewed and updated and listed below   Patient Care Team: Merna Huxley, NP as PCP - General (Family Medicine)   Past Medical History:  Diagnosis Date   Allergy    Colon polyps    history of precancerous   History of chicken pox    Ovarian cancer (HCC)    diagnosed 2 years ago-treated by a physician in [place]     Past Surgical History:  Procedure Laterality Date   ABDOMINAL HYSTERECTOMY  2019   APPENDECTOMY  2019   BREAST BIOPSY Right 2019   benign per patient   TONSILLECTOMY  1965    Social History   Socioeconomic History   Marital status: Married    Spouse name: Not on file   Number of children: Not on file   Years of education: Not on file   Highest education level: Not on file   Occupational History   Not on file  Tobacco Use   Smoking status: Some Days    Types: Cigarettes   Smokeless tobacco: Never   Tobacco comments:    2 cigarettes daily  Vaping Use   Vaping status: Never Used  Substance and Sexual Activity   Alcohol use: Yes    Comment: Occ wine   Drug use: Not Currently   Sexual activity: Not Currently  Other Topics Concern   Not on file  Social History Narrative   Not on file   Social Drivers of Health   Financial Resource Strain: Low Risk  (07/07/2024)   Overall Financial Resource Strain (CARDIA)    Difficulty of Paying Living Expenses: Not hard at all  Food Insecurity: No Food Insecurity (07/07/2024)   Hunger Vital Sign    Worried About Running Out of Food in the Last Year: Never true    Ran Out of Food in the Last Year: Never true  Transportation Needs: No Transportation Needs (07/07/2024)   PRAPARE - Administrator, Civil Service (Medical): No    Lack of Transportation (Non-Medical): No  Physical Activity: Insufficiently Active (07/07/2024)   Exercise Vital Sign    Days of Exercise per Week: 4 days    Minutes of Exercise per Session: 30 min  Stress: No Stress Concern Present (07/07/2024)  [employer] of Occupational Health - Occupational Stress Questionnaire    Feeling of Stress: Not at all  Social Connections: Moderately Isolated (07/07/2024)   Social Connection and Isolation Panel    Frequency of Communication with Friends and Family: More than three times a week    Frequency of Social Gatherings with Friends and Family: Never    Attends Religious Services: Never    Database Administrator or Organizations: No    Attends Banker Meetings: Never    Marital Status: Married  Catering Manager Violence: Not At Risk (07/07/2024)   Humiliation, Afraid, Rape, and Kick questionnaire    Fear of Current or Ex-Partner: No    Emotionally Abused: No    Physically Abused: No    Sexually Abused: No     Family History  Problem Relation Age of Onset   Arthritis Mother    Diabetes Mother    Hearing loss Mother    Hypertension Mother    Hyperlipidemia Mother    Depression Father    Heart disease Father    Heart attack Father    Multiple myeloma Brother    Early death Brother    Hearing loss Maternal Grandmother    Alcohol abuse Maternal Grandfather    Cancer Maternal Grandfather    Cancer Paternal Grandmother    Early death Paternal Grandmother    Miscarriages / Stillbirths Paternal Grandmother    Diabetes Paternal Grandfather    Colon cancer Neg Hx    Breast cancer Neg Hx    Ovarian cancer Neg Hx    Endometrial cancer Neg Hx    Pancreatic cancer Neg Hx    Prostate cancer Neg Hx     Current Outpatient Medications on File Prior to Visit  Medication Sig Dispense Refill   ALPRAZolam  (XANAX ) 1 MG tablet Take 1 tablet (1 mg total) by mouth 2 (two) times daily as needed for anxiety. 45 tablet 0   ASCORBIC ACID PO Take by mouth daily in the afternoon.     Cholecalciferol (D3 VITAMIN PO) Take by mouth daily in the afternoon.     Lactobacillus (PROBIOTIC ACIDOPHILUS PO) Take by mouth daily in the afternoon.     magnesium gluconate (MAGONATE) 500 MG tablet Take 500 mg by mouth 2 (two) times daily.     No current facility-administered medications on file prior to visit.    Allergies  Allergen Reactions   Bee Venom Other (See Comments)       Physical Exam Vitals requested from patient and listed below if patient had equipment and was able to obtain at home for this virtual visit: There were no vitals filed for this visit. Estimated body mass index is 19.94 kg/m as calculated from the following:   Height as of 08/18/23: 5' 2 (1.575 m).   Weight as of 08/18/23: 109 lb (49.4 kg).  EKG (optional): deferred due to virtual visit  GENERAL: alert, oriented, no acute distress detected, full vision exam deferred due to pandemic and/or virtual encounter  HEENT: atraumatic,  conjunttiva clear, no obvious abnormalities on inspection of external nose and ears  NECK: normal movements of the head and neck  LUNGS: on inspection no signs of respiratory distress, breathing rate appears normal, no obvious gross SOB, gasping or wheezing  CV: no obvious cyanosis  MS: moves all visible extremities without noticeable abnormality  PSYCH/NEURO: pleasant and cooperative, no obvious depression or anxiety, speech and thought processing grossly intact, Cognitive function grossly intact  Flowsheet Row Clinical Support  from 07/07/2024 in [facility]  PHQ-9 Total Score 0        07/07/2024   10:55 AM 07/07/2024   10:13 AM 12/30/2023   11:54 AM 07/21/2023    9:34 AM 06/18/2023   10:01 AM  Depression screen PHQ 2/9  Decreased Interest 0 0  1 0  Down, Depressed, Hopeless 0 0  1 0  PHQ - 2 Score 0 0  2 0  Altered sleeping  0  0   Tired, decreased energy  0  1   Change in appetite  0  0   Feeling bad or failure about yourself   0  0   Trouble concentrating  0  1   Moving slowly or fidgety/restless  0  0   Suicidal thoughts  0  0   PHQ-9 Score  0  4   Difficult doing work/chores  Not difficult at all  Somewhat difficult      Information is confidential and restricted. Go to Review Flowsheets to unlock data.       08/19/2022    1:46 PM 06/18/2023    8:50 AM 06/18/2023   10:00 AM 07/07/2024   10:12 AM 07/07/2024   10:54 AM  Fall Risk  Falls in the past year?  0 0 0 0  Was there an injury with Fall?  0 0 0 0  Fall Risk Category Calculator  0  0 0 0  (RETIRED) Patient Fall Risk Level Low fall risk       Patient at Risk for Falls Due to    No Fall Risks   Fall risk Follow up    Falls evaluation completed Falls evaluation completed;Education provided     Patient-reported   Data saved with a previous flowsheet row definition     SUMMARY AND PLAN:  Encounter for Medicare annual wellness exam   Discussed applicable health  maintenance/preventive health measures and advised and referred or ordered per patient preferences: -discussed risk/benefits of vaccines due, she plans to get the flu shot at her in office physical Health Maintenance  Topic Date Due   DTaP/Tdap/Td (1 - Tdap) Never done   Zoster Vaccines- Shingrix (1 of 2) Never done   COVID-19 Vaccine (3 - Moderna risk series) 01/10/2020   Influenza Vaccine  04/08/2024   Medicare Annual Wellness (AWV)  07/07/2025   Mammogram  02/10/2026   Colonoscopy  09/09/2027   Pneumococcal Vaccine: 50+ Years  Completed   DEXA SCAN  Completed   Hepatitis C Screening  Completed   Meningococcal B Vaccine  Aged Out      Education and counseling on the following was provided based on the above review of health and a plan/checklist for the patient, along with additional information discussed, was provided for the patient in the patient instructions :  -Advised and counseled on a healthy lifestyle  -Reviewed patient's current diet. Advised and counseled on a whole foods based healthy diet. A summary of a healthy diet was provided in the Patient Instructions.  -reviewed patient's current physical activity level and discussed exercise guidelines for adults. Discussed community resources and ideas for safe exercise at home to assist in meeting exercise guideline recommendations in a safe and healthy way.  -Advise yearly dental visits at minimum and regular eye exams -advised to let PCP know about weight again at the physical  -checked with staff on alprazolam  refill and they are working on the refill, let them know she is out  and is wanting refill today.  -Advised and counseled on alcohol safe limits, risks/ tobacco use, risks of smoking and counseled on risks Follow up: see patient instructions     Patient Instructions  I really enjoyed getting to talk with you today! I am available on Tuesdays and Thursdays for virtual visits if you have any questions or concerns, or if  I can be of any further assistance.   CHECKLIST FROM ANNUAL WELLNESS VISIT:  -Follow up (please call to schedule if not scheduled after visit):   -yearly for annual wellness visit with primary care office  Here is a list of your preventive care/health maintenance measures and the plan for each if any are due:  PLAN For any measures below that may be due:    1. Can get vaccines at the pharmacy. Please let us  know if you do so that we can update your records.   Health Maintenance  Topic Date Due   DTaP/Tdap/Td (1 - Tdap) Never done   Zoster Vaccines- Shingrix (1 of 2) Never done   COVID-19 Vaccine (3 - Moderna risk series) 01/10/2020   Influenza Vaccine  04/08/2024   Medicare Annual Wellness (AWV)  06/17/2024   Mammogram  02/10/2026   Colonoscopy  09/09/2027   Pneumococcal Vaccine: 50+ Years  Completed   DEXA SCAN  Completed   Hepatitis C Screening  Completed   Meningococcal B Vaccine  Aged Out    -See a dentist at least yearly  -Get your eyes checked and then per your eye specialist's recommendations  -Other issues addressed today:   -I have included below further information regarding a healthy whole foods based diet, physical activity guidelines for adults, stress management and opportunities for social connections. I hope you find this information useful.   -----------------------------------------------------------------------------------------------------------------------------------------------------------------------------------------------------------------------------------------------------------    NUTRITION: -eat real food: lots of colorful vegetables (half the plate) and fruits -5-7 servings of vegetables and fruits per day (fresh or steamed is best), exp. 2 servings of vegetables with lunch and dinner and 2 servings of fruit per day. Berries and greens such as kale and collards are great choices.  -consume on a regular basis:  fresh fruits, fresh veggies,  fish, nuts, seeds, healthy oils (such as olive oil, avocado oil), whole grains (make sure for bread/pasta/crackers/etc., that the first ingredient on label contains the word whole), legumes. -can eat small amounts of dairy and lean meat (no larger than the palm of your hand), but avoid processed meats such as ham, bacon, lunch meat, etc. -drink water -try to avoid fast food and pre-packaged foods, processed meat, ultra processed foods/beverages (donuts, candy, etc.) -most experts advise limiting sodium to < 2300mg  per day, should limit further is any chronic conditions such as high blood pressure, heart disease, diabetes, etc. The American Heart Association advised that < 1500mg  is is ideal -try to avoid foods/beverages that contain any ingredients with names you do not recognize  -try to avoid foods/beverages  with added sugar or sweeteners/sweets  -try to avoid sweet drinks (including diet drinks): soda, juice, Gatorade, sweet tea, power drinks, diet drinks -try to avoid white rice, white bread, pasta (unless whole grain)  EXERCISE GUIDELINES FOR ADULTS: -if you wish to increase your physical activity, do so gradually and with the approval of your doctor -STOP and seek medical care immediately if you have any chest pain, chest discomfort or trouble breathing when starting or increasing exercise  -move and stretch your body, legs, feet and arms when sitting for long periods -  Physical activity guidelines for optimal health in adults: -get at least 150 minutes per week of moderate exercise (can talk, but not sing); this is about 20-30 minutes of sustained activity 5-7 days per week or two 10-15 minute episodes of sustained activity 5-7 days per week -do some muscle building/resistance training/strength training at least 2 days per week  -balance exercises 3+ days per week:   Stand somewhere where you have something sturdy to hold onto if you lose balance    1) lift up on toes, then back down,  start with 5x per day and work up to 20x   2) stand and lift one leg straight out to the side so that foot is a few inches of the floor, start with 5x each side and work up to 20x each side   3) stand on one foot, start with 5 seconds each side and work up to 20 seconds on each side  If you need ideas or help with getting more active:  -Silver sneakers https://tools.silversneakers.com  -Walk with a Doc: Http://www.duncan-williams.com/  -try to include resistance (weight lifting/strength building) and balance exercises twice per week: or the following link for ideas: http://castillo-powell.com/  buyducts.dk  STRESS MANAGEMENT: -can try meditating, or just sitting quietly with deep breathing while intentionally relaxing all parts of your body for 5 minutes daily -if you need further help with stress, anxiety or depression please follow up with your primary doctor or contact the wonderful folks at [facility]: [phone]  SOCIAL CONNECTIONS: -options in [place] if you wish to engage in more social and exercise related activities:  -Silver sneakers https://tools.silversneakers.com  -Walk with a Doc: Http://www.duncan-williams.com/  -Check out the [facility] Active Adults 50+ section on the [place] of [employer] companies (hiking clubs, book clubs, cards and games, chess, exercise classes, aquatic classes and much more) - see the website for details: https://www.[place].gov/departments/parks-recreation/active-adults50  -YouTube has lots of exercise videos for different ages and abilities as well  -[facility] (a variety of indoor and outdoor inperson activities for adults). [phone]. [address]  -Virtual Online Classes (a variety of topics): see seniorplanet.org or call 978-598-4681  -consider volunteering at a school, hospice center, church, senior center or  elsewhere            Chiquita JONELLE Cramp, DO

## 2024-07-07 NOTE — Progress Notes (Signed)
 Patient unable to obtain vital signs due to telehealth visit

## 2024-07-07 NOTE — Patient Instructions (Addendum)
 I really enjoyed getting to talk with you today! I am available on Tuesdays and Thursdays for virtual visits if you have any questions or concerns, or if I can be of any further assistance.   CHECKLIST FROM ANNUAL WELLNESS VISIT:  -Follow up (please call to schedule if not scheduled after visit):   -yearly for annual wellness visit with primary care office  Here is a list of your preventive care/health maintenance measures and the plan for each if any are due:  PLAN For any measures below that may be due:    1. Can get vaccines at the pharmacy. Please let us  know if you do so that we can update your records.   Health Maintenance  Topic Date Due   DTaP/Tdap/Td (1 - Tdap) Never done   Zoster Vaccines- Shingrix (1 of 2) Never done   COVID-19 Vaccine (3 - Moderna risk series) 01/10/2020   Influenza Vaccine  04/08/2024   Medicare Annual Wellness (AWV)  07/07/2025   Mammogram  02/10/2026   Colonoscopy  09/09/2027   Pneumococcal Vaccine: 50+ Years  Completed   DEXA SCAN  Completed   Hepatitis C Screening  Completed   Meningococcal B Vaccine  Aged Out    -See a dentist at least yearly  -Get your eyes checked and then per your eye specialist's recommendations  -Other issues addressed today:   -I have included below further information regarding a healthy whole foods based diet, physical activity guidelines for adults, stress management and opportunities for social connections. I hope you find this information useful.   -----------------------------------------------------------------------------------------------------------------------------------------------------------------------------------------------------------------------------------------------------------    NUTRITION: -eat real food: lots of colorful vegetables (half the plate) and fruits -5-7 servings of vegetables and fruits per day (fresh or steamed is best), exp. 2 servings of vegetables with lunch and dinner and 2  servings of fruit per day. Berries and greens such as kale and collards are great choices.  -consume on a regular basis:  fresh fruits, fresh veggies, fish, nuts, seeds, healthy oils (such as olive oil, avocado oil), whole grains (make sure for bread/pasta/crackers/etc., that the first ingredient on label contains the word whole), legumes. -can eat small amounts of dairy and lean meat (no larger than the palm of your hand), but avoid processed meats such as ham, bacon, lunch meat, etc. -drink water -try to avoid fast food and pre-packaged foods, processed meat, ultra processed foods/beverages (donuts, candy, etc.) -most experts advise limiting sodium to < 2300mg  per day, should limit further is any chronic conditions such as high blood pressure, heart disease, diabetes, etc. The American Heart Association advised that < 1500mg  is is ideal -try to avoid foods/beverages that contain any ingredients with names you do not recognize  -try to avoid foods/beverages  with added sugar or sweeteners/sweets  -try to avoid sweet drinks (including diet drinks): soda, juice, Gatorade, sweet tea, power drinks, diet drinks -try to avoid white rice, white bread, pasta (unless whole grain)  EXERCISE GUIDELINES FOR ADULTS: -if you wish to increase your physical activity, do so gradually and with the approval of your doctor -STOP and seek medical care immediately if you have any chest pain, chest discomfort or trouble breathing when starting or increasing exercise  -move and stretch your body, legs, feet and arms when sitting for long periods -Physical activity guidelines for optimal health in adults: -get at least 150 minutes per week of moderate exercise (can talk, but not sing); this is about 20-30 minutes of sustained activity 5-7 days per week or  two 10-15 minute episodes of sustained activity 5-7 days per week -do some muscle building/resistance training/strength training at least 2 days per week  -balance  exercises 3+ days per week:   Stand somewhere where you have something sturdy to hold onto if you lose balance    1) lift up on toes, then back down, start with 5x per day and work up to 20x   2) stand and lift one leg straight out to the side so that foot is a few inches of the floor, start with 5x each side and work up to 20x each side   3) stand on one foot, start with 5 seconds each side and work up to 20 seconds on each side  If you need ideas or help with getting more active:  -Silver sneakers https://tools.silversneakers.com  -Walk with a Doc: Http://www.duncan-williams.com/  -try to include resistance (weight lifting/strength building) and balance exercises twice per week: or the following link for ideas: http://castillo-powell.com/  buyducts.dk  STRESS MANAGEMENT: -can try meditating, or just sitting quietly with deep breathing while intentionally relaxing all parts of your body for 5 minutes daily -if you need further help with stress, anxiety or depression please follow up with your primary doctor or contact the wonderful folks at [facility]: [phone]  SOCIAL CONNECTIONS: -options in [place] if you wish to engage in more social and exercise related activities:  -Silver sneakers https://tools.silversneakers.com  -Walk with a Doc: Http://www.duncan-williams.com/  -Check out the [facility] Active Adults 50+ section on the [employer] (hiking clubs, book clubs, cards and games, chess, exercise classes, aquatic classes and much more) - see the website for details: https://www.[place].gov/departments/parks-recreation/active-adults50  -YouTube has lots of exercise videos for different ages and abilities as well  -[facility] (a variety of indoor and outdoor inperson activities for adults). [phone]. [address]  -Virtual Online  Classes (a variety of topics): see seniorplanet.org or call [phone]  -consider volunteering at a school, hospice center, church, senior center or elsewhere

## 2024-07-07 NOTE — Telephone Encounter (Signed)
 Printed by accident

## 2024-07-13 ENCOUNTER — Ambulatory Visit (INDEPENDENT_AMBULATORY_CARE_PROVIDER_SITE_OTHER): Admitting: Clinical

## 2024-07-13 DIAGNOSIS — F419 Anxiety disorder, unspecified: Secondary | ICD-10-CM

## 2024-07-13 DIAGNOSIS — Z630 Problems in relationship with spouse or partner: Secondary | ICD-10-CM | POA: Diagnosis not present

## 2024-07-13 DIAGNOSIS — F330 Major depressive disorder, recurrent, mild: Secondary | ICD-10-CM | POA: Diagnosis not present

## 2024-07-14 ENCOUNTER — Encounter (HOSPITAL_COMMUNITY): Payer: Self-pay | Admitting: Clinical

## 2024-07-14 NOTE — Progress Notes (Signed)
 THERAPIST PROGRESS NOTE  Session Time: 11:04am-12:00pm  Session #62  Participation Level: Active  Behavioral Response: Casual Alert Negative and Euthymic  Type of Therapy: Individual Therapy  Treatment Goals addressed:  LTG: Melissa Holmes will be able to identify 5-7 cognitive distortions she has and will learn how to come up with replacement thoughts that are more balanced, realistic, and helpful.  STG: Melissa Holmes will learn methods of remaining detached from her loved ones so that she does not become depressed about the circumstances of their lives that they are choosing  STG: Melissa Holmes will identify situations, thoughts, and feelings that trigger internal anger, and/or angry/aggressive actions as evidenced by self-report  LTG: Melissa Holmes will become aware of the feelings underlying her anger, specifically regarding husband's drinking and work to resolve those underlying emotions in order to reduce her anger  STG: Melissa Holmes will work on detachment from her husband's drinking in order to be healthier emotionally, physically, and spiritually herself. LTG: Process life events to the extent needed so that will be able to move forward with various areas of life in a better frame of mind per self-report.   STG: Address and process recent bereavement, feelings, coping skills, supports, meaning, closure and grief-related issues.   LTG: Score less than 9 on the PHQ-9 and less than 5 on the GAD-7 as evidenced by intermittent administration of the questionnaires to determine progress in managing depression and anxiety. STG: Work to arts development officer from models like CBT, Stages of Change, DBT, shame resilience theory, ACT, SFBT, MI, trauma-informed therapy and others to be able to manage mental health symptoms, AEB practicing out of session and reporting back.  LTG: Learn and practice communication techniques such as active listening, I statements, open-ended questions, fair fighting rules, initiating  conversations;  learn about boundary types and how to implement/enforce them AEB self-report of use of same.  STG: Learn a variety of breathing techniques and grounding strategies, practice in session then report independent application out of session.  LTG: Explore personal core beliefs, rules and assumptions, and cognitive distortions through therapist using Cognitive Behavioral Therapy; learn about Behavioral Activation and Acting As If.  STG: Learn emotion regulation strategies, distress tolerance skills, interpersonal effectiveness techniques, and mindfulness practices and use them in session and in life situations to improve results and satisfaction  ProgressTowards Goals: Progressing  Interventions: Supportive and Other: detachment   Summary: Melissa Holmes is a 68 y.o. female who presents with increased anxiety and anger with her partner who is an alcoholic, wanting to learn how to better manage her emotions and thoughts.  She presented oriented x5 and said she felt fine, I only had one meltdown this week so I'm doing well.  CSW evaluated patient's use of coping tools and self-care.  She provided an update on various aspects of her life that are normally discussed in therapy, including ongoing issues dealing with husband's drinking, behaviors, and health issues.  He continues to talk about her hating on me when she speaks loudly due to him not wearing his hearing aids.  She expressed anger at the loss of specialty dentist who left the practice after working with them for 6 months to treat her husband's dental problems.  She continues to try in many ways to detach herself from husband's choices.  She refused to buy him wine when they already had wine in the house, so he went out on his own and purchased some.  She was proud of herself for maintaining her detachment.  Suicidal/Homicidal: No without intent/plan  Therapist Response:  Patient is progressing AEB engaging in scheduled  therapy session.  Throughout the session, CSW gave patient the opportunity to explore thoughts and feelings associated with current life situations and past/present stressors.   CSW challenged patient gently and appropriately to consider different ways of looking at reported issues. CSW encouraged patient's expression of feelings and validated these using empathy, active listening, open body language, and unconditional positive regard.     Plan/Recommendations:  Return to therapy in 1 week to next scheduled appointment on 11/12, reflect on what was discussed in session, engage in self care behaviors as explored in session  Diagnosis:  Mild episode of recurrent depressive disorder  Anxiety disorder, unspecified type  Relationship problem between partners  Collaboration of Care: Patient refused AEB - not interested in medicines    Patient/Guardian was advised Release of Information must be obtained prior to any record release in order to collaborate their care with an outside provider. Patient/Guardian was advised if they have not already done so to contact the registration department to sign all necessary forms in order for us  to release information regarding their care.   Consent: Patient/Guardian gives verbal consent for treatment and assignment of benefits for services provided during this visit. Patient/Guardian expressed understanding and agreed to proceed.   Elgie JINNY Crest, LCSW 06/25/2023

## 2024-07-20 ENCOUNTER — Ambulatory Visit (INDEPENDENT_AMBULATORY_CARE_PROVIDER_SITE_OTHER): Admitting: Clinical

## 2024-07-20 DIAGNOSIS — F419 Anxiety disorder, unspecified: Secondary | ICD-10-CM

## 2024-07-20 DIAGNOSIS — Z6372 Alcoholism and drug addiction in family: Secondary | ICD-10-CM | POA: Diagnosis not present

## 2024-07-20 DIAGNOSIS — F330 Major depressive disorder, recurrent, mild: Secondary | ICD-10-CM | POA: Diagnosis not present

## 2024-07-21 ENCOUNTER — Encounter (HOSPITAL_COMMUNITY): Payer: Self-pay | Admitting: Clinical

## 2024-07-21 ENCOUNTER — Ambulatory Visit (INDEPENDENT_AMBULATORY_CARE_PROVIDER_SITE_OTHER): Payer: Medicare Other | Admitting: Adult Health

## 2024-07-21 VITALS — BP 104/60 | HR 85 | Temp 97.6°F | Ht 63.0 in | Wt 111.0 lb

## 2024-07-21 DIAGNOSIS — M8589 Other specified disorders of bone density and structure, multiple sites: Secondary | ICD-10-CM

## 2024-07-21 DIAGNOSIS — Z0000 Encounter for general adult medical examination without abnormal findings: Secondary | ICD-10-CM

## 2024-07-21 DIAGNOSIS — Z23 Encounter for immunization: Secondary | ICD-10-CM

## 2024-07-21 DIAGNOSIS — F32A Depression, unspecified: Secondary | ICD-10-CM

## 2024-07-21 DIAGNOSIS — Z8543 Personal history of malignant neoplasm of ovary: Secondary | ICD-10-CM

## 2024-07-21 LAB — LIPID PANEL
Cholesterol: 194 mg/dL (ref 0–200)
HDL: 90 mg/dL (ref >39.00)
LDL Cholesterol: 89 mg/dL (ref 0–99)
NonHDL: 103.62
Total CHOL/HDL Ratio: 2
Triglycerides: 75 mg/dL (ref 0.0–149.0)
VLDL: 15 mg/dL (ref 0.0–40.0)

## 2024-07-21 LAB — CBC WITH DIFFERENTIAL/PLATELET
Basophils Absolute: 0 K/uL (ref 0.0–0.1)
Basophils Relative: 0.9 % (ref 0.0–3.0)
Eosinophils Absolute: 0.1 K/uL (ref 0.0–0.7)
Eosinophils Relative: 1.7 % (ref 0.0–5.0)
HCT: 36.4 % (ref 36.0–46.0)
Hemoglobin: 12.6 g/dL (ref 12.0–15.0)
Lymphocytes Relative: 43.7 % (ref 12.0–46.0)
Lymphs Abs: 2.1 K/uL (ref 0.7–4.0)
MCHC: 34.5 g/dL (ref 30.0–36.0)
MCV: 97.2 fl (ref 78.0–100.0)
Monocytes Absolute: 0.3 K/uL (ref 0.1–1.0)
Monocytes Relative: 6.9 % (ref 3.0–12.0)
Neutro Abs: 2.3 K/uL (ref 1.4–7.7)
Neutrophils Relative %: 46.8 % (ref 43.0–77.0)
Platelets: 347 K/uL (ref 150.0–400.0)
RBC: 3.75 Mil/uL — ABNORMAL LOW (ref 3.87–5.11)
RDW: 14 % (ref 11.5–15.5)
WBC: 4.9 K/uL (ref 4.0–10.5)

## 2024-07-21 LAB — COMPREHENSIVE METABOLIC PANEL WITH GFR
ALT: 12 U/L (ref 0–35)
AST: 18 U/L (ref 0–37)
Albumin: 4.8 g/dL (ref 3.5–5.2)
Alkaline Phosphatase: 21 U/L — ABNORMAL LOW (ref 39–117)
BUN: 9 mg/dL (ref 6–23)
CO2: 29 meq/L (ref 19–32)
Calcium: 9.8 mg/dL (ref 8.4–10.5)
Chloride: 98 meq/L (ref 96–112)
Creatinine, Ser: 0.72 mg/dL (ref 0.40–1.20)
GFR: 85.45 mL/min (ref >60.00)
Glucose, Bld: 96 mg/dL (ref 70–99)
Potassium: 4.1 meq/L (ref 3.5–5.1)
Sodium: 136 meq/L (ref 135–145)
Total Bilirubin: 0.4 mg/dL (ref 0.2–1.2)
Total Protein: 7.3 g/dL (ref 6.0–8.3)

## 2024-07-21 LAB — VITAMIN D 25 HYDROXY (VIT D DEFICIENCY, FRACTURES): VITD: 80.02 ng/mL (ref 30.00–100.00)

## 2024-07-21 LAB — TSH: TSH: 2.29 u[IU]/mL (ref 0.35–5.50)

## 2024-07-21 NOTE — Progress Notes (Signed)
 THERAPIST PROGRESS NOTE  Session Time: 11:03am-12:03pm  Session #63  Participation Level: Active  Behavioral Response: Casual Alert Euthymicb  Type of Therapy: Individual Therapy  Treatment Goals addressed:  LTG: [name] will be able to identify 5-7 cognitive distortions she has and will learn how to come up with replacement thoughts that are more balanced, realistic, and helpful.  STG: [name] will learn methods of remaining detached from her loved ones so that she does not become depressed about the circumstances of their lives that they are choosing  STG: Melissa Kathy will identify situations, thoughts, and feelings that trigger internal anger, and/or angry/aggressive actions as evidenced by self-report  LTG: [name] will become aware of the feelings underlying her anger, specifically regarding husband's drinking and work to resolve those underlying emotions in order to reduce her anger  STG: [name] will work on detachment from her husband's drinking in order to be healthier emotionally, physically, and spiritually herself. LTG: Process life events to the extent needed so that will be able to move forward with various areas of life in a better frame of mind per self-report.   STG: Address and process recent bereavement, feelings, coping skills, supports, meaning, closure and grief-related issues.   LTG: Score less than 9 on the PHQ-9 and less than 5 on the GAD-7 as evidenced by intermittent administration of the questionnaires to determine progress in managing depression and anxiety. STG: Work to arts development officer from models like CBT, Stages of Change, DBT, shame resilience theory, ACT, SFBT, MI, trauma-informed therapy and others to be able to manage mental health symptoms, AEB practicing out of session and reporting back.  LTG: Learn and practice communication techniques such as active listening, I statements, open-ended questions, fair fighting rules, initiating conversations;  learn  about boundary types and how to implement/enforce them AEB self-report of use of same.  STG: Learn a variety of breathing techniques and grounding strategies, practice in session then report independent application out of session.  LTG: Explore personal core beliefs, rules and assumptions, and cognitive distortions through therapist using Cognitive Behavioral Therapy; learn about Behavioral Activation and Acting As If.  STG: Learn emotion regulation strategies, distress tolerance skills, interpersonal effectiveness techniques, and mindfulness practices and use them in session and in life situations to improve results and satisfaction  ProgressTowards Goals: Progressing  Interventions: Psychosocial Skills: communication skills, Supportive, and Other: detachment   Summary: Melissa Holmes is a 68 y.o. female who presents with increased anxiety and anger with her partner who is an alcoholic, wanting to learn how to better manage her emotions and thoughts.  She presented oriented x5 and said she felt the week was fine until yesterday, even though all weekend she could hear her partner crying at night.  CSW evaluated patient's use of coping tools and self-care.  She provided an update on various aspects of her life that are normally discussed in therapy, including finding evidence in the form of bloody pillow cases that her partner did not do very well during the month she was in [place], must have been falling more than she realized.  Although she was very frank about his alcohol consumption with his doctor during partner's wellness visit last week, the only action the doctor took about it was to remove the prescription for Alprazolam .  Her partner continues to ask her if she is going to kick him out of the home, and she has reiterated again that she will not kick him out, but she does not know how much  longer she will be living there with him.  Because her descriptions of interactions with partner sound like  she is very directive with him, CSW suggested to her that she might get further with him if she asks questions instead.  Examples were given and she expressed understanding.  As we had previously discussed the possibility of CSW arranging for someone from Alcoholics Anonymous meeting with him to do a 12th step meeting, in other words to share their experience, strength and hope, CSW queried if she has thought about this further.  She responded that she thinks it would be a good idea and asked CSW to move forward with it   Suicidal/Homicidal: No without intent/plan  Therapist Response:  Patient is progressing AEB engaging in scheduled therapy session.  Throughout the session, CSW gave patient the opportunity to explore thoughts and feelings associated with current life situations and past/present stressors.   CSW challenged patient gently and appropriately to consider different ways of looking at reported issues. CSW encouraged patient's expression of feelings and validated these using empathy, active listening, open body language, and unconditional positive regard.     Plan/Recommendations:  Return to therapy in 1 week to next scheduled appointment on 11/19, reflect on what was discussed in session, do homework as assigned (asking husband questions instead of directing him, facilitate meeting with AA member if that can be arranged before then), engage in self care behaviors as explored in session  Diagnosis:  Mild episode of recurrent depressive disorder  Anxiety disorder, unspecified type  Alcoholic spouse  Collaboration of Care: Patient refused AEB - not interested in medicines    Patient/Guardian was advised Release of Information must be obtained prior to any record release in order to collaborate their care with an outside provider. Patient/Guardian was advised if they have not already done so to contact the registration department to sign all necessary forms in order for us  to release  information regarding their care.   Consent: Patient/Guardian gives verbal consent for treatment and assignment of benefits for services provided during this visit. Patient/Guardian expressed understanding and agreed to proceed.   Elgie JINNY Crest, LCSW 06/25/2023

## 2024-07-21 NOTE — Progress Notes (Signed)
 Subjective:    Patient ID: Melissa Holmes, female    DOB: [birth year], 68 y.o.   MRN: [id number]  HPI Patient presents for yearly preventative medicine examination. She is a pleasant 68 year old female who  has a past medical history of Allergy, Colon polyps, History of chicken pox, and Ovarian cancer (HCC).  History of ovarian cancer-diagnosed in 2019 at which time she had a hysterectomy.  She reports that she has been released from Oncology. She would like her CA-125 checked today    Anxiety/Depression-takes Xanax  1 mg at bedtime and is currently seeing a counselor and finds this helpful.   Osteopenia -last bone density in April 2024 showed osteopenia with a T-score of 1.6. She is taking vitamin D  5000 units.   All immunizations and health maintenance protocols were reviewed with the patient and needed orders were placed.  Appropriate screening laboratory values were ordered for the patient including screening of hyperlipidemia, renal function and hepatic function.  Medication reconciliation,  past medical history, social history, problem list and allergies were reviewed in detail with the patient  Goals were established with regard to weight loss, exercise, and  diet in compliance with medications Wt Readings from Last 3 Encounters:  07/21/24 111 lb (50.3 kg)  08/18/23 109 lb (49.4 kg)  07/21/23 108 lb (49 kg)   Review of Systems  Constitutional: Negative.   HENT: Negative.    Eyes: Negative.   Respiratory: Negative.    Cardiovascular: Negative.   Gastrointestinal: Negative.   Endocrine: Negative.   Genitourinary: Negative.   Musculoskeletal: Negative.   Skin: Negative.   Allergic/Immunologic: Negative.   Neurological: Negative.   Hematological: Negative.   Psychiatric/Behavioral: Negative.     Past Medical History:  Diagnosis Date   Allergy    Colon polyps    history of precancerous   History of chicken pox    Ovarian cancer (HCC)    diagnosed 2 years  ago-treated by a physician in [place]     Social History   Socioeconomic History   Marital status: Married    Spouse name: Not on file   Number of children: Not on file   Years of education: Not on file   Highest education level: Bachelor's degree (e.g., BA, AB, BS)  Occupational History   Not on file  Tobacco Use   Smoking status: Some Days    Types: Cigarettes   Smokeless tobacco: Never   Tobacco comments:    2 cigarettes daily  Vaping Use   Vaping status: Never Used  Substance and Sexual Activity   Alcohol use: Yes    Comment: Occ wine   Drug use: Not Currently   Sexual activity: Not Currently  Other Topics Concern   Not on file  Social History Narrative   Not on file   Social Drivers of Health   Financial Resource Strain: Low Risk  (07/21/2024)   Overall Financial Resource Strain (CARDIA)    Difficulty of Paying Living Expenses: Not hard at all  Food Insecurity: No Food Insecurity (07/21/2024)   Hunger Vital Sign    Worried About Running Out of Food in the Last Year: Never true    Ran Out of Food in the Last Year: Never true  Transportation Needs: No Transportation Needs (07/21/2024)   PRAPARE - Administrator, Civil Service (Medical): No    Lack of Transportation (Non-Medical): No  Physical Activity: Insufficiently Active (07/21/2024)   Exercise Vital Sign    Days of Exercise per  Week: 3 days    Minutes of Exercise per Session: 30 min  Stress: Stress Concern Present (07/21/2024)   [employer] of Occupational Health - Occupational Stress Questionnaire    Feeling of Stress: To some extent  Social Connections: Unknown (07/21/2024)   Social Connection and Isolation Panel    Frequency of Communication with Friends and Family: Twice a week    Frequency of Social Gatherings with Friends and Family: Patient declined    Attends Religious Services: Never    Database Administrator or Organizations: No    Attends Engineer, Structural: Not on  file    Marital Status: Living with partner  Recent Concern: Social Connections - Moderately Isolated (07/07/2024)   Social Connection and Isolation Panel    Frequency of Communication with Friends and Family: More than three times a week    Frequency of Social Gatherings with Friends and Family: Never    Attends Religious Services: Never    Database Administrator or Organizations: No    Attends Banker Meetings: Never    Marital Status: Married  Catering Manager Violence: Not At Risk (07/07/2024)   Humiliation, Afraid, Rape, and Kick questionnaire    Fear of Current or Ex-Partner: No    Emotionally Abused: No    Physically Abused: No    Sexually Abused: No    Past Surgical History:  Procedure Laterality Date   ABDOMINAL HYSTERECTOMY  2019   APPENDECTOMY  2019   BREAST BIOPSY Right 2019   benign per patient   TONSILLECTOMY  1965    Family History  Problem Relation Age of Onset   Arthritis Mother    Diabetes Mother    Hearing loss Mother    Hypertension Mother    Hyperlipidemia Mother    Depression Father    Heart disease Father    Heart attack Father    Multiple myeloma Brother    Early death Brother    Hearing loss Maternal Grandmother    Alcohol abuse Maternal Grandfather    Cancer Maternal Grandfather    Cancer Paternal Grandmother    Early death Paternal Grandmother    Miscarriages / Stillbirths Paternal Grandmother    Diabetes Paternal Grandfather    Colon cancer Neg Hx    Breast cancer Neg Hx    Ovarian cancer Neg Hx    Endometrial cancer Neg Hx    Pancreatic cancer Neg Hx    Prostate cancer Neg Hx     Allergies  Allergen Reactions   Bee Venom Other (See Comments)    Current Outpatient Medications on File Prior to Visit  Medication Sig Dispense Refill   ALPRAZolam  (XANAX ) 1 MG tablet Take 1 tablet (1 mg total) by mouth 2 (two) times daily as needed for anxiety. (Patient taking differently: Take 1 mg by mouth daily.) 45 tablet 0    ASCORBIC ACID PO Take by mouth daily in the afternoon.     Cholecalciferol (D3 VITAMIN PO) Take by mouth daily in the afternoon.     Lactobacillus (PROBIOTIC ACIDOPHILUS PO) Take by mouth daily in the afternoon.     magnesium gluconate (MAGONATE) 500 MG tablet Take 500 mg by mouth 2 (two) times daily.     No current facility-administered medications on file prior to visit.    BP 104/60   Pulse 85   Temp 97.6 F (36.4 C) (Oral)   Ht 5' 3 (1.6 m)   Wt 111 lb (50.3 kg)   SpO2 96%  BMI 19.66 kg/m       Objective:   Physical Exam Vitals and nursing note reviewed.  Constitutional:      General: She is not in acute distress.    Appearance: Normal appearance. She is not ill-appearing.  HENT:     Head: Normocephalic and atraumatic.     Right Ear: Tympanic membrane, ear canal and external ear normal. There is no impacted cerumen.     Left Ear: Tympanic membrane, ear canal and external ear normal. There is no impacted cerumen.     Nose: Nose normal. No congestion or rhinorrhea.     Mouth/Throat:     Mouth: Mucous membranes are moist.     Pharynx: Oropharynx is clear.  Eyes:     Extraocular Movements: Extraocular movements intact.     Conjunctiva/sclera: Conjunctivae normal.     Pupils: Pupils are equal, round, and reactive to light.  Neck:     Vascular: No carotid bruit.  Cardiovascular:     Rate and Rhythm: Normal rate and regular rhythm.     Pulses: Normal pulses.     Heart sounds: No murmur heard.    No friction rub. No gallop.  Pulmonary:     Effort: Pulmonary effort is normal.     Breath sounds: Normal breath sounds.  Abdominal:     General: Abdomen is flat. Bowel sounds are normal. There is no distension.     Palpations: Abdomen is soft. There is no mass.     Tenderness: There is no abdominal tenderness. There is no guarding or rebound.     Hernia: No hernia is present.  Musculoskeletal:        General: Normal range of motion.     Cervical back: Normal range of  motion and neck supple.  Lymphadenopathy:     Cervical: No cervical adenopathy.  Skin:    General: Skin is warm and dry.     Capillary Refill: Capillary refill takes less than 2 seconds.  Neurological:     General: No focal deficit present.     Mental Status: She is alert and oriented to person, place, and time.  Psychiatric:        Mood and Affect: Mood normal.        Behavior: Behavior normal.        Thought Content: Thought content normal.        Judgment: Judgment normal.       Assessment & Plan:  1. Routine general medical examination at a health care facility (Primary) Today patient counseled on age appropriate routine health concerns for screening and prevention, each reviewed and up to date or declined. Immunizations reviewed and up to date or declined. Labs ordered and reviewed. Risk factors for depression reviewed and negative. Hearing function and visual acuity are intact. ADLs screened and addressed as needed. Functional ability and level of safety reviewed and appropriate. Education, counseling and referrals performed based on assessed risks today. Patient provided with a copy of personalized plan for preventive services.   2. History of malignant neoplasm of ovary  - CBC with Differential/Platelet; Future - Comprehensive metabolic panel with GFR; Future - Lipid panel; Future - TSH; Future - CA 125; Future - CA 125 - Lipid panel - TSH - Comprehensive metabolic panel with GFR - CBC with Differential/Platelet  3. Anxiety and depression - No change. Continue with Xanax  1 mg at bedtime and therapy  - CBC with Differential/Platelet; Future - Comprehensive metabolic panel with GFR; Future - Lipid  panel; Future - TSH; Future - Lipid panel - TSH - Comprehensive metabolic panel with GFR - CBC with Differential/Platelet  4. Osteopenia of multiple sites - Repeat bone density screen next year - CBC with Differential/Platelet; Future - Comprehensive metabolic panel  with GFR; Future - Lipid panel; Future - TSH; Future - VITAMIN D  25 Hydroxy (Vit-D Deficiency, Fractures); Future - VITAMIN D  25 Hydroxy (Vit-D Deficiency, Fractures) - Lipid panel - TSH - Comprehensive metabolic panel with GFR - CBC with Differential/Platelet  5. Need for influenza vaccination  - Flu vaccine HIGH DOSE PF(Fluzone Trivalent)   Alexandru Moorer, NP

## 2024-07-22 LAB — CA 125: CA 125: 8 U/mL (ref <35)

## 2024-07-24 ENCOUNTER — Ambulatory Visit: Payer: Self-pay | Admitting: Adult Health

## 2024-07-27 ENCOUNTER — Encounter (HOSPITAL_COMMUNITY): Payer: Self-pay | Admitting: Clinical

## 2024-07-27 ENCOUNTER — Ambulatory Visit (INDEPENDENT_AMBULATORY_CARE_PROVIDER_SITE_OTHER): Admitting: Clinical

## 2024-07-27 DIAGNOSIS — Z6372 Alcoholism and drug addiction in family: Secondary | ICD-10-CM

## 2024-07-27 DIAGNOSIS — F330 Major depressive disorder, recurrent, mild: Secondary | ICD-10-CM

## 2024-07-27 DIAGNOSIS — F419 Anxiety disorder, unspecified: Secondary | ICD-10-CM

## 2024-07-27 NOTE — Progress Notes (Signed)
 THERAPIST PROGRESS NOTE  Session Time: 11:07am-12:07pm  Session #64  Participation Level: Active  Behavioral Response: Casual Alert Euthymic and Tearful at end  Type of Therapy: Individual Therapy  Treatment Goals addressed:  LTG: Melissa Holmes will be able to identify 5-7 cognitive distortions she has and will learn how to come up with replacement thoughts that are more balanced, realistic, and helpful.  STG: Melissa Holmes will learn methods of remaining detached from her loved ones so that she does not become depressed about the circumstances of their lives that they are choosing  STG: Melissa Holmes will identify situations, thoughts, and feelings that trigger internal anger, and/or angry/aggressive actions as evidenced by self-report  LTG: Melissa Holmes will become aware of the feelings underlying her anger, specifically regarding husband's drinking and work to resolve those underlying emotions in order to reduce her anger  STG: Melissa Holmes will work on detachment from her husband's drinking in order to be healthier emotionally, physically, and spiritually herself. LTG: Process life events to the extent needed so that will be able to move forward with various areas of life in a better frame of mind per self-report.   STG: Address and process recent bereavement, feelings, coping skills, supports, meaning, closure and grief-related issues.   LTG: Score less than 9 on the PHQ-9 and less than 5 on the GAD-7 as evidenced by intermittent administration of the questionnaires to determine progress in managing depression and anxiety. STG: Work to arts development officer from models like CBT, Stages of Change, DBT, shame resilience theory, ACT, SFBT, MI, trauma-informed therapy and others to be able to manage mental health symptoms, AEB practicing out of session and reporting back.  LTG: Learn and practice communication techniques such as active listening, I statements, open-ended questions, fair fighting rules, initiating  conversations;  learn about boundary types and how to implement/enforce them AEB self-report of use of same.  STG: Learn a variety of breathing techniques and grounding strategies, practice in session then report independent application out of session.  LTG: Explore personal core beliefs, rules and assumptions, and cognitive distortions through therapist using Cognitive Behavioral Therapy; learn about Behavioral Activation and Acting As If.  STG: Learn emotion regulation strategies, distress tolerance skills, interpersonal effectiveness techniques, and mindfulness practices and use them in session and in life situations to improve results and satisfaction  ProgressTowards Goals: Progressing  Interventions: Psychosocial Skills: communication skills, Supportive, and Other: detachment   Summary: Melissa Holmes is a 68 y.o. female who presents with increased anxiety and anger with her partner who is an alcoholic, wanting to learn how to better manage her emotions and thoughts.  She presented oriented x5 and said she felt I'm surviving but it has been a vile week.  CSW evaluated patient's use of coping tools and self-care.  She provided an update on various aspects of her life that are normally discussed in therapy, including getting sick from the flu shot, partner starting to drink much more wine than he has been, a fall he experienced, and their ongoing discussion about whether she is going to kick him out.  She had tried to keep him from drinking more by standing in front of the refrigerator at one point, but he became unpleasant so she moved.  He wanted her to pick up wine and she has done that to keep him from driving, but now she has refused.  She did tell him to learn how to use [employer] or [employer] if he wants to get it delivered, but declined to teach  him how to use those services.  She told how her sister-in-law who has dementia in [place]  has now had her driver's license revoked by the  police and that the same might happen to her partner if he gets out and drives, that this would be the next natural occurrence in his behavior.  He fell a couple of days ago on the patio, took 2-1/2 hours to get back into the house, leaving a puddle of blood behind.  As he laid there unable to get up, she wanted to let him have it but restrained herself and realized that she has been acting as his drug dealer. She stated that although she has cleaned the entire patio in order to put up Christmas decorations, she has left the dried blood as a reminder to her to not enable his behavior and a reminder to him of his drinking consequences.  He has continued asking her if she is going to kick him out, asking for 30 days notice so he can find another place to live, and she has not answered just No, but has answered Are you offering to leave?  She thinks she will be moving out in early 2026.  As he  continues to knock paintings off walls and breaks dishes and furniture, she disclosed that she has a plan for protecting the possessions that she cherishes, but taking them down and storing them in her bedroom with the door padlocked.  She said that as soon as she gets a floor mopped, he vomits on it again.  The bodily fluids, especially the blood, are very hard for her to manage.  Patient became tearful when she stated that we are nearing the time that her mother became ill, fell, and died last year.  We talked about how she can and will handle her feelings, especially focusing on just allowing herself to feel whatever it is that she does feel.   Suicidal/Homicidal: No without intent/plan  Therapist Response:  Patient is progressing AEB engaging in scheduled therapy session.  Throughout the session, CSW gave patient the opportunity to explore thoughts and feelings associated with current life situations and past/present stressors.   CSW challenged patient gently and appropriately to consider different ways of  looking at reported issues. CSW encouraged patient's expression of feelings and validated these using empathy, active listening, open body language, and unconditional positive regard.     Plan/Recommendations:  Return to therapy in 2 weeks to next scheduled appointment on 12/3, reflect on what was discussed in session, do homework as assigned (allow herself to feel her grief, think about help being a character asset that can become controlling a character defect), engage in self care behaviors as explored in session  Diagnosis:  Mild episode of recurrent depressive disorder  Anxiety disorder, unspecified type  Alcoholic spouse  Collaboration of Care: Patient refused AEB - not interested in medicines    Patient/Guardian was advised Release of Information must be obtained prior to any record release in order to collaborate their care with an outside provider. Patient/Guardian was advised if they have not already done so to contact the registration department to sign all necessary forms in order for us  to release information regarding their care.   Consent: Patient/Guardian gives verbal consent for treatment and assignment of benefits for services provided during this visit. Patient/Guardian expressed understanding and agreed to proceed.   Elgie JINNY Crest, LCSW 06/25/2023

## 2024-08-03 ENCOUNTER — Ambulatory Visit (INDEPENDENT_AMBULATORY_CARE_PROVIDER_SITE_OTHER): Admitting: Clinical

## 2024-08-03 DIAGNOSIS — Z634 Disappearance and death of family member: Secondary | ICD-10-CM

## 2024-08-03 DIAGNOSIS — F330 Major depressive disorder, recurrent, mild: Secondary | ICD-10-CM | POA: Diagnosis not present

## 2024-08-03 DIAGNOSIS — F419 Anxiety disorder, unspecified: Secondary | ICD-10-CM | POA: Diagnosis not present

## 2024-08-03 DIAGNOSIS — Z6372 Alcoholism and drug addiction in family: Secondary | ICD-10-CM

## 2024-08-06 ENCOUNTER — Encounter (HOSPITAL_COMMUNITY): Payer: Self-pay | Admitting: Clinical

## 2024-08-06 NOTE — Progress Notes (Signed)
 THERAPIST PROGRESS NOTE  Session Time: 12:05pm-1:00pm  Session #65  Participation Level: Active  Behavioral Response: Casual Alert Euthymic and Irritable  Type of Therapy: Individual Therapy  Treatment Goals addressed:  LTG: Melissa Holmes will be able to identify 5-7 cognitive distortions she has and will learn how to come up with replacement thoughts that are more balanced, realistic, and helpful.  STG: Melissa Holmes will learn methods of remaining detached from her loved ones so that she does not become depressed about the circumstances of their lives that they are choosing  STG: Melissa Holmes will identify situations, thoughts, and feelings that trigger internal anger, and/or angry/aggressive actions as evidenced by self-report  LTG: Melissa Holmes will become aware of the feelings underlying her anger, specifically regarding husband's drinking and work to resolve those underlying emotions in order to reduce her anger  STG: Melissa Holmes will work on detachment from her husband's drinking in order to be healthier emotionally, physically, and spiritually herself. LTG: Process life events to the extent needed so that will be able to move forward with various areas of life in a better frame of mind per self-report.   STG: Address and process recent bereavement, feelings, coping skills, supports, meaning, closure and grief-related issues.   LTG: Score less than 9 on the PHQ-9 and less than 5 on the GAD-7 as evidenced by intermittent administration of the questionnaires to determine progress in managing depression and anxiety. STG: Work to arts development officer from models like CBT, Stages of Change, DBT, shame resilience theory, ACT, SFBT, MI, trauma-informed therapy and others to be able to manage mental health symptoms, AEB practicing out of session and reporting back.  LTG: Learn and practice communication techniques such as active listening, I statements, open-ended questions, fair fighting rules, initiating  conversations;  learn about boundary types and how to implement/enforce them AEB self-report of use of same.  STG: Learn a variety of breathing techniques and grounding strategies, practice in session then report independent application out of session.  LTG: Explore personal core beliefs, rules and assumptions, and cognitive distortions through therapist using Cognitive Behavioral Therapy; learn about Behavioral Activation and Acting As If.  STG: Learn emotion regulation strategies, distress tolerance skills, interpersonal effectiveness techniques, and mindfulness practices and use them in session and in life situations to improve results and satisfaction  ProgressTowards Goals: Progressing  Interventions: Supportive and Other: detachment and grief   Summary: Melissa Holmes is a 68 y.o. female who presents with increased anxiety and anger with her partner who is an alcoholic, wanting to learn how to better manage her emotions and thoughts.  She presented oriented x5 and said she felt I can't believe this.  CSW evaluated patient's use of coping tools and self-care.  She provided an update on various aspects of her life that are normally discussed in therapy, including her recent trip to another town to see a friend, her husband's ongoing cognitive decline, and what she will be doing to handle Thanksgiving this year, remembering that it was on Thanksgiving Day last year that her mother fell and eventually was hospitalized and died before Christmas.  We processed her grief over not only her mother, but also over the changes in her friend who has Parkinson's and was not cordial or engaged with patient at all.  We also processed her ongoing grief over husband's continued drinking and the way she is losing him day by day to alcohol.  When she went away for 2 days, he was convinced she had left for good.  Suicidal/Homicidal: No without intent/plan  Therapist Response:  Patient is progressing AEB  engaging in scheduled therapy session.  Throughout the session, CSW gave patient the opportunity to explore thoughts and feelings associated with current life situations and past/present stressors.   CSW challenged patient gently and appropriately to consider different ways of looking at reported issues. CSW encouraged patient's expression of feelings and validated these using empathy, active listening, open body language, and unconditional positive regard.     Plan/Recommendations:  Return to therapy in 1 week to next scheduled appointment on 12/3, reflect on what was discussed in session, do homework as assigned (allow herself to feel her grief this holiday season, consider writing a letter to her mother), engage in self care behaviors as explored in session  Diagnosis:  Mild episode of recurrent depressive disorder  Anxiety disorder, unspecified type  Alcoholic spouse  Recent bereavement  Collaboration of Care: Patient refused AEB - not interested in medicines    Patient/Guardian was advised Release of Information must be obtained prior to any record release in order to collaborate their care with an outside provider. Patient/Guardian was advised if they have not already done so to contact the registration department to sign all necessary forms in order for us  to release information regarding their care.   Consent: Patient/Guardian gives verbal consent for treatment and assignment of benefits for services provided during this visit. Patient/Guardian expressed understanding and agreed to proceed.   Elgie JINNY Crest, LCSW 06/25/2023

## 2024-08-10 ENCOUNTER — Ambulatory Visit (INDEPENDENT_AMBULATORY_CARE_PROVIDER_SITE_OTHER): Admitting: Clinical

## 2024-08-10 DIAGNOSIS — F419 Anxiety disorder, unspecified: Secondary | ICD-10-CM

## 2024-08-10 DIAGNOSIS — Z634 Disappearance and death of family member: Secondary | ICD-10-CM

## 2024-08-10 DIAGNOSIS — F330 Major depressive disorder, recurrent, mild: Secondary | ICD-10-CM | POA: Diagnosis not present

## 2024-08-10 DIAGNOSIS — Z6372 Alcoholism and drug addiction in family: Secondary | ICD-10-CM | POA: Diagnosis not present

## 2024-08-11 ENCOUNTER — Encounter (HOSPITAL_COMMUNITY): Payer: Self-pay | Admitting: Clinical
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
## 2024-08-22 ENCOUNTER — Other Ambulatory Visit: Payer: Self-pay | Admitting: Adult Health

## 2024-08-22 DIAGNOSIS — F419 Anxiety disorder, unspecified: Secondary | ICD-10-CM

## 2024-08-24 ENCOUNTER — Ambulatory Visit (HOSPITAL_COMMUNITY): Admitting: Clinical

## 2024-08-24 DIAGNOSIS — Z6372 Alcoholism and drug addiction in family: Secondary | ICD-10-CM | POA: Diagnosis not present

## 2024-08-24 DIAGNOSIS — F330 Major depressive disorder, recurrent, mild: Secondary | ICD-10-CM | POA: Diagnosis not present

## 2024-08-24 DIAGNOSIS — F419 Anxiety disorder, unspecified: Secondary | ICD-10-CM | POA: Diagnosis not present

## 2024-08-27 ENCOUNTER — Encounter (HOSPITAL_COMMUNITY): Payer: Self-pay | Admitting: Clinical

## 2024-08-27 NOTE — Progress Notes (Signed)
 THERAPIST PROGRESS NOTE  Session Time: 11:01am-12:01pm  Session #67  Participation Level: Active  Behavioral Response: Casual Alert Depressed  Type of Therapy: Individual Therapy  Treatment Goals addressed:  LTG: Melissa Holmes will be able to identify 5-7 cognitive distortions she has and will learn how to come up with replacement thoughts that are more balanced, realistic, and helpful.  STG: Melissa Holmes will learn methods of remaining detached from her loved ones so that she does not become depressed about the circumstances of their lives that they are choosing  STG: Melissa Holmes Kathy will identify situations, thoughts, and feelings that trigger internal anger, and/or angry/aggressive actions as evidenced by self-report  LTG: Melissa Holmes will become aware of the feelings underlying her anger, specifically regarding husband's drinking and work to resolve those underlying emotions in order to reduce her anger  STG: Melissa Holmes will work on detachment from her husband's drinking in order to be healthier emotionally, physically, and spiritually herself. LTG: Process life events to the extent needed so that will be able to move forward with various areas of life in a better frame of mind per self-report.   STG: Address and process recent bereavement, feelings, coping skills, supports, meaning, closure and grief-related issues.   LTG: Score less than 9 on the PHQ-9 and less than 5 on the GAD-7 as evidenced by intermittent administration of the questionnaires to determine progress in managing depression and anxiety. STG: Work to arts development officer from models like CBT, Stages of Change, DBT, shame resilience theory, ACT, SFBT, MI, trauma-informed therapy and others to be able to manage mental health symptoms, AEB practicing out of session and reporting back.  LTG: Learn and practice communication techniques such as active listening, I statements, open-ended questions, fair fighting rules, initiating conversations;  learn  about boundary types and how to implement/enforce them AEB self-report of use of same.  STG: Learn a variety of breathing techniques and grounding strategies, practice in session then report independent application out of session.  LTG: Explore personal core beliefs, rules and assumptions, and cognitive distortions through therapist using Cognitive Behavioral Therapy; learn about Behavioral Activation and Acting As If.  STG: Learn emotion regulation strategies, distress tolerance skills, interpersonal effectiveness techniques, and mindfulness practices and use them in session and in life situations to improve results and satisfaction  ProgressTowards Goals: Progressing  Interventions: Supportive and Other: detachment and grief   Summary: Melissa Holmes is a 68 y.o. female who presents with increased anxiety and anger with her partner who is an alcoholic, wanting to learn how to better manage her emotions and thoughts.  She presented oriented x5 and said she felt okay.  She explained the efforts she is making to remain confident and engaged in life by taking care of her presentation to the outside world in the way she dresses and accessorizes.  CSW evaluated patient's use of coping tools and self-care.  She provided an update on various aspects of her life that are normally discussed in therapy, including the first anniversary of her mother's death and how she handled that, her husband's ongoing binge drinking that seems worse now but is interspersed with him not drinking, and her thoughts about moving out of her home.  The primary care physician prescribed Naltrexone for her husband but he took it only 3 days and deliberately drank more than usual in an attempt to render the medication ineffective.  When he drinks, he is being quite belligerent and mean with his words and this gets her closer all the time  to moving out.  This was the main topic explored during her session, with CSW using probing  questions to see what is different now, what she will use as criteria to make the decision, and such.  We also processed the reaction both she and her brother had on the day of her mother's death anniversary.  It was both harder and easier than she expected it to be.  She was sad throughout the session, not laughing as much as usual, was provided a safe space to express herself freely.   Suicidal/Homicidal: No without intent/plan  Therapist Response:  Patient is progressing AEB engaging in scheduled therapy session.  Throughout the session, CSW gave patient the opportunity to explore thoughts and feelings associated with current life situations and past/present stressors.   CSW challenged patient gently and appropriately to consider different ways of looking at reported issues. CSW encouraged patients expression of feelings and validated these using empathy, active listening, open body language, and unconditional positive regard.     Plan/Recommendations:  Return to therapy in 1 week to next scheduled appointment on 12/24, reflect on what was discussed in session, do homework as assigned (continue to allow herself to feel her grief this holiday season, continue to consider writing a letter to her mother), engage in self care behaviors as explored in session  Diagnosis:  Mild episode of recurrent depressive disorder  Anxiety disorder, unspecified type  Alcoholic spouse  Collaboration of Care: Patient refused AEB - not interested in medicines    Patient/Guardian was advised Release of Information must be obtained prior to any record release in order to collaborate their care with an outside provider. Patient/Guardian was advised if they have not already done so to contact the registration department to sign all necessary forms in order for us  to release information regarding their care.   Consent: Patient/Guardian gives verbal consent for treatment and assignment of benefits for services provided  during this visit. Patient/Guardian expressed understanding and agreed to proceed.   Elgie JINNY Crest, LCSW 06/25/2023

## 2024-08-31 ENCOUNTER — Encounter (HOSPITAL_COMMUNITY): Payer: Self-pay | Admitting: Clinical

## 2024-08-31 ENCOUNTER — Ambulatory Visit (INDEPENDENT_AMBULATORY_CARE_PROVIDER_SITE_OTHER): Admitting: Clinical

## 2024-08-31 DIAGNOSIS — Z6372 Alcoholism and drug addiction in family: Secondary | ICD-10-CM

## 2024-08-31 DIAGNOSIS — F419 Anxiety disorder, unspecified: Secondary | ICD-10-CM | POA: Diagnosis not present

## 2024-08-31 DIAGNOSIS — F330 Major depressive disorder, recurrent, mild: Secondary | ICD-10-CM | POA: Diagnosis not present
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
## 2024-09-21 ENCOUNTER — Ambulatory Visit (INDEPENDENT_AMBULATORY_CARE_PROVIDER_SITE_OTHER): Admitting: Clinical

## 2024-09-21 DIAGNOSIS — Z6372 Alcoholism and drug addiction in family: Secondary | ICD-10-CM | POA: Diagnosis not present

## 2024-09-21 DIAGNOSIS — F330 Major depressive disorder, recurrent, mild: Secondary | ICD-10-CM | POA: Diagnosis not present

## 2024-09-21 DIAGNOSIS — Z634 Disappearance and death of family member: Secondary | ICD-10-CM

## 2024-09-21 DIAGNOSIS — F419 Anxiety disorder, unspecified: Secondary | ICD-10-CM | POA: Diagnosis not present

## 2024-09-21 NOTE — Progress Notes (Signed)
 THERAPIST PROGRESS NOTE  Session Time: 11:02am-12:02pm  Session #69  Participation Level: Active  Behavioral Response: Casual Alert Euthymic  Type of Therapy: Individual Therapy  Treatment Goals addressed:  LTG: [name] will be able to identify 5-7 cognitive distortions she has and will learn how to come up with replacement thoughts that are more balanced, realistic, and helpful.  STG: [name] will learn methods of remaining detached from her loved ones so that she does not become depressed about the circumstances of their lives that they are choosing  STG: [name] will identify situations, thoughts, and feelings that trigger internal anger, and/or angry/aggressive actions as evidenced by self-report  LTG: [name] will become aware of the feelings underlying her anger, specifically regarding husband's drinking and work to resolve those underlying emotions in order to reduce her anger  STG: [name] will work on detachment from her husband's drinking in order to be healthier emotionally, physically, and spiritually herself. LTG: Process life events to the extent needed so that will be able to move forward with various areas of life in a better frame of mind per self-report.   STG: Address and process recent bereavement, feelings, coping skills, supports, meaning, closure and grief-related issues.   LTG: Score less than 9 on the PHQ-9 and less than 5 on the GAD-7 as evidenced by intermittent administration of the questionnaires to determine progress in managing depression and anxiety. STG: Work to arts development officer from models like CBT, Stages of Change, DBT, shame resilience theory, ACT, SFBT, MI, trauma-informed therapy and others to be able to manage mental health symptoms, AEB practicing out of session and reporting back.  LTG: Learn and practice communication techniques such as active listening, I statements, open-ended questions, fair fighting rules, initiating conversations;  learn  about boundary types and how to implement/enforce them AEB self-report of use of same.  STG: Learn a variety of breathing techniques and grounding strategies, practice in session then report independent application out of session.  LTG: Explore personal core beliefs, rules and assumptions, and cognitive distortions through therapist using Cognitive Behavioral Therapy; learn about Behavioral Activation and Acting As If.  STG: Learn emotion regulation strategies, distress tolerance skills, interpersonal effectiveness techniques, and mindfulness practices and use them in session and in life situations to improve results and satisfaction  ProgressTowards Goals: Progressing  Interventions: Solution Focused and Supportive   Summary: [name] Hunzeker is a 69 y.o. female who presents with increased anxiety and anger with her partner who is an alcoholic, wanting to learn how to better manage her emotions and thoughts.  Patient is typically seen weekly by her own request but had not been seen for three weeks and presented somewhat negatively and disgruntled about the gap in sessions. She reported that her husband has been easier to live with over the past three weeks, which she attributes to his reduction in daily wine consumption. Although he complains about taking Naltrexone and states it makes no difference, he remains compliant with the medication. She reported a significant decrease in episodes of severe intoxication, noting that he has rarely become intoxicated to the point of falling. Prior to medication, he frequently fell, injured himself, and broke household items. He continues to drink wine only and has not consumed hard liquor for several months.  She even expressed comfort with him driving to purchase his own wine, as long as he does not go to the [facility] store.  Patient stated that taking trips, even short ones, has been beneficial for both her and  the relationship. She noted her husband has been  eating large amounts of ice cream as a substitute for alcohol but expressed minimal concern, stating, He is more of a human being now, and reported he has not recently called her degrading names. She shared with some surprise that he has resumed going shopping with her weekly on Thursdays and has checked in with her to ensure she is genuinely comfortable with him declining invitations to events.  Patient described steps she has taken to improve her own wellbeing, including attending a senior exercise seminar, visiting cousins, and engaging positively with customers at work. Session included processing ongoing grief related to her mothers death one year ago, which has been intensified by the recent death of her mothers brother. She stated she was glad her mother did not have to experience this additional loss. CSW reminded patient of the safety plan created during the last session due to her husbands prior threatening behavior and reiterated concerns that circumstances can change rapidly with alcohol use. CSW reinforced patients healthy detachment while maintaining love for her husband. Patient agreed with this assessment and expressed pride in her progress.   Suicidal/Homicidal: No without intent/plan  Therapist Response:  Patient demonstrates improved emotional regulation, insight, and boundary-setting within her marriage. She appears more empowered and at peace as a result of appropriate detachment and increased self-care activities. Grief remains active but is being processed with insight and reflection. Risk related to husbands alcohol use appears reduced at present but remains a concern given history of volatility; patient is aware of this and receptive to reminders of safety planning. No acute safety concerns reported by patient during session.   Plan/Recommendations:  Return to therapy at next scheduled appointment on 1/21, reflect on what was discussed in session, do homework as assigned (read  early chapters of Big Book), engage in self care behaviors as explored in session  Diagnosis:  Mild episode of recurrent depressive disorder  Anxiety disorder, unspecified type  Alcoholic spouse  Recent bereavement  Collaboration of Care: Patient refused AEB - not interested in medicines    Patient/Guardian was advised Release of Information must be obtained prior to any record release in order to collaborate their care with an outside provider. Patient/Guardian was advised if they have not already done so to contact the registration department to sign all necessary forms in order for us  to release information regarding their care.   Consent: Patient/Guardian gives verbal consent for treatment and assignment of benefits for services provided during this visit. Patient/Guardian expressed understanding and agreed to proceed.   Elgie JINNY Crest, LCSW 06/24/2024

## 2024-09-22 ENCOUNTER — Encounter (HOSPITAL_COMMUNITY): Payer: Self-pay | Admitting: Clinical

## 2024-09-28 ENCOUNTER — Ambulatory Visit (HOSPITAL_COMMUNITY): Admitting: Clinical

## 2024-09-28 ENCOUNTER — Encounter (HOSPITAL_COMMUNITY): Payer: Self-pay | Admitting: Clinical

## 2024-09-28 DIAGNOSIS — Z6372 Alcoholism and drug addiction in family: Secondary | ICD-10-CM

## 2024-09-28 DIAGNOSIS — F330 Major depressive disorder, recurrent, mild: Secondary | ICD-10-CM

## 2024-09-28 DIAGNOSIS — F419 Anxiety disorder, unspecified: Secondary | ICD-10-CM | POA: Diagnosis not present

## 2024-09-28 NOTE — Progress Notes (Unsigned)
 THERAPIST PROGRESS NOTE  Session Time: 11:03am-12:03pm  Session #70  Participation Level: Active  Behavioral Response: Casual Alert Euthymic  Type of Therapy: Individual Therapy  Treatment Goals addressed: *** LTG: [name] will be able to identify 5-7 cognitive distortions she has and will learn how to come up with replacement thoughts that are more balanced, realistic, and helpful.  STG: [name] will learn methods of remaining detached from her loved ones so that she does not become depressed about the circumstances of their lives that they are choosing  STG: [name] will identify situations, thoughts, and feelings that trigger internal anger, and/or angry/aggressive actions as evidenced by self-report  LTG: [name] will become aware of the feelings underlying her anger, specifically regarding husband's drinking and work to resolve those underlying emotions in order to reduce her anger  STG: [name] will work on detachment from her husband's drinking in order to be healthier emotionally, physically, and spiritually herself. LTG: Process life events to the extent needed so that will be able to move forward with various areas of life in a better frame of mind per self-report.   STG: Address and process recent bereavement, feelings, coping skills, supports, meaning, closure and grief-related issues.   LTG: Score less than 9 on the PHQ-9 and less than 5 on the GAD-7 as evidenced by intermittent administration of the questionnaires to determine progress in managing depression and anxiety. STG: Work to arts development officer from models like CBT, Stages of Change, DBT, shame resilience theory, ACT, SFBT, MI, trauma-informed therapy and others to be able to manage mental health symptoms, AEB practicing out of session and reporting back.  LTG: Learn and practice communication techniques such as active listening, I statements, open-ended questions, fair fighting rules, initiating conversations;  learn  about boundary types and how to implement/enforce them AEB self-report of use of same.  STG: Learn a variety of breathing techniques and grounding strategies, practice in session then report independent application out of session.  LTG: Explore personal core beliefs, rules and assumptions, and cognitive distortions through therapist using Cognitive Behavioral Therapy; learn about Behavioral Activation and Acting As If.  STG: Learn emotion regulation strategies, distress tolerance skills, interpersonal effectiveness techniques, and mindfulness practices and use them in session and in life situations to improve results and satisfaction  ProgressTowards Goals: Progressing  Interventions: Solution Focused and Supportive ***  Summary: [name] Venables is a 69 y.o. female who presents with increased anxiety and anger with her partner who is an alcoholic, wanting to learn how to better manage her emotions and thoughts.  ***   Suicidal/Homicidal: No without intent/plan  Therapist Response:  Patient demonstrates improved emotional regulation, insight, and boundary-setting within her marriage. She appears more empowered and at peace as a result of appropriate detachment and increased self-care activities. Grief remains active but is being processed with insight and reflection. Risk related to husbands alcohol use appears reduced at present but remains a concern given history of volatility; patient is aware of this and receptive to reminders of safety planning. No acute safety concerns reported by patient during session.   Plan/Recommendations:  Return to therapy at next scheduled appointment on 2/11, reflect on what was discussed in session, do homework as assigned (read early chapters of Big Book), engage in self care behaviors as explored in session  Diagnosis:  Mild episode of recurrent depressive disorder  Anxiety disorder, unspecified type  Alcoholic spouse  Collaboration of Care: Patient  refused AEB - not interested in medicines  Patient/Guardian was advised Release of Information must be obtained prior to any record release in order to collaborate their care with an outside provider. Patient/Guardian was advised if they have not already done so to contact the registration department to sign all necessary forms in order for us  to release information regarding their care.   Consent: Patient/Guardian gives verbal consent for treatment and assignment of benefits for services provided during this visit. Patient/Guardian expressed understanding and agreed to proceed.   Elgie JINNY Crest, LCSW 06/24/2024

## 2024-10-05 ENCOUNTER — Ambulatory Visit (HOSPITAL_COMMUNITY): Admitting: Clinical

## 2024-10-12 ENCOUNTER — Ambulatory Visit (HOSPITAL_COMMUNITY): Admitting: Clinical

## 2024-10-19 ENCOUNTER — Ambulatory Visit (HOSPITAL_COMMUNITY): Admitting: Clinical

## 2024-10-26 ENCOUNTER — Ambulatory Visit (HOSPITAL_COMMUNITY): Admitting: Clinical

## 2024-11-02 ENCOUNTER — Ambulatory Visit (HOSPITAL_COMMUNITY): Admitting: Clinical

## 2024-11-09 ENCOUNTER — Ambulatory Visit (HOSPITAL_COMMUNITY): Admitting: Clinical

## 2024-11-16 ENCOUNTER — Ambulatory Visit (HOSPITAL_COMMUNITY): Admitting: Clinical

## 2024-11-23 ENCOUNTER — Ambulatory Visit (HOSPITAL_COMMUNITY): Admitting: Clinical

## 2024-11-30 ENCOUNTER — Ambulatory Visit (HOSPITAL_COMMUNITY): Admitting: Clinical

## 2024-12-07 ENCOUNTER — Ambulatory Visit (HOSPITAL_COMMUNITY): Admitting: Clinical

## 2024-12-14 ENCOUNTER — Ambulatory Visit (HOSPITAL_COMMUNITY): Admitting: Clinical

## 2024-12-21 ENCOUNTER — Ambulatory Visit (HOSPITAL_COMMUNITY): Admitting: Clinical

## 2024-12-28 ENCOUNTER — Ambulatory Visit (HOSPITAL_COMMUNITY): Admitting: Clinical

## 2025-01-04 ENCOUNTER — Ambulatory Visit (HOSPITAL_COMMUNITY): Admitting: Clinical

## 2025-01-11 ENCOUNTER — Ambulatory Visit (HOSPITAL_COMMUNITY): Admitting: Clinical

## 2025-01-18 ENCOUNTER — Ambulatory Visit (HOSPITAL_COMMUNITY): Admitting: Clinical

## 2025-01-25 ENCOUNTER — Ambulatory Visit (HOSPITAL_COMMUNITY): Admitting: Clinical

## 2025-02-01 ENCOUNTER — Ambulatory Visit (HOSPITAL_COMMUNITY): Admitting: Clinical

## 2025-02-08 ENCOUNTER — Ambulatory Visit (HOSPITAL_COMMUNITY): Admitting: Clinical

## 2025-02-15 ENCOUNTER — Ambulatory Visit (HOSPITAL_COMMUNITY): Admitting: Clinical

## 2025-02-22 ENCOUNTER — Ambulatory Visit (HOSPITAL_COMMUNITY): Admitting: Clinical

## 2025-03-01 ENCOUNTER — Ambulatory Visit (HOSPITAL_COMMUNITY): Admitting: Clinical

## 2025-03-08 ENCOUNTER — Ambulatory Visit (HOSPITAL_COMMUNITY): Admitting: Clinical

## 2025-03-15 ENCOUNTER — Ambulatory Visit (HOSPITAL_COMMUNITY): Admitting: Clinical

## 2025-03-22 ENCOUNTER — Ambulatory Visit (HOSPITAL_COMMUNITY): Admitting: Clinical

## 2025-03-29 ENCOUNTER — Ambulatory Visit (HOSPITAL_COMMUNITY): Admitting: Clinical

## 2025-04-05 ENCOUNTER — Ambulatory Visit (HOSPITAL_COMMUNITY): Admitting: Clinical

## 2025-04-12 ENCOUNTER — Ambulatory Visit (HOSPITAL_COMMUNITY): Admitting: Clinical

## 2025-04-19 ENCOUNTER — Ambulatory Visit (HOSPITAL_COMMUNITY): Admitting: Clinical

## 2025-04-26 ENCOUNTER — Ambulatory Visit (HOSPITAL_COMMUNITY): Admitting: Clinical

## 2025-05-03 ENCOUNTER — Ambulatory Visit (HOSPITAL_COMMUNITY): Admitting: Clinical

## 2025-05-10 ENCOUNTER — Ambulatory Visit (HOSPITAL_COMMUNITY): Admitting: Clinical

## 2025-05-17 ENCOUNTER — Ambulatory Visit (HOSPITAL_COMMUNITY): Admitting: Clinical

## 2025-05-24 ENCOUNTER — Ambulatory Visit (HOSPITAL_COMMUNITY): Admitting: Clinical

## 2025-05-31 ENCOUNTER — Ambulatory Visit (HOSPITAL_COMMUNITY): Admitting: Clinical

## 2025-06-07 ENCOUNTER — Ambulatory Visit (HOSPITAL_COMMUNITY): Admitting: Clinical

## 2025-06-14 ENCOUNTER — Ambulatory Visit (HOSPITAL_COMMUNITY): Admitting: Clinical

## 2025-06-21 ENCOUNTER — Ambulatory Visit (HOSPITAL_COMMUNITY): Admitting: Clinical

## 2025-06-28 ENCOUNTER — Ambulatory Visit (HOSPITAL_COMMUNITY): Admitting: Clinical

## 2025-07-05 ENCOUNTER — Ambulatory Visit (HOSPITAL_COMMUNITY): Admitting: Clinical

## 2025-07-12 ENCOUNTER — Ambulatory Visit (HOSPITAL_COMMUNITY): Admitting: Clinical

## 2025-07-19 ENCOUNTER — Ambulatory Visit (HOSPITAL_COMMUNITY): Admitting: Clinical

## 2025-07-25 ENCOUNTER — Encounter: Admitting: Adult Health

## 2025-07-26 ENCOUNTER — Ambulatory Visit (HOSPITAL_COMMUNITY): Admitting: Clinical

## 2025-08-02 ENCOUNTER — Ambulatory Visit (HOSPITAL_COMMUNITY): Admitting: Clinical

## 2025-08-09 ENCOUNTER — Ambulatory Visit (HOSPITAL_COMMUNITY): Admitting: Clinical

## 2025-08-16 ENCOUNTER — Ambulatory Visit (HOSPITAL_COMMUNITY): Admitting: Clinical
